# Patient Record
Sex: Female | Born: 1975
Health system: Southern US, Community
[De-identification: ages and names within clinical notes are randomized; demographics above are authoritative.]

## PROBLEM LIST (undated history)

## (undated) ENCOUNTER — Inpatient Hospital Stay (HOSPITAL_COMMUNITY): Payer: Self-pay

## (undated) DIAGNOSIS — F419 Anxiety disorder, unspecified: Secondary | ICD-10-CM

## (undated) DIAGNOSIS — I10 Essential (primary) hypertension: Secondary | ICD-10-CM

## (undated) DIAGNOSIS — T7840XA Allergy, unspecified, initial encounter: Secondary | ICD-10-CM

## (undated) HISTORY — DX: Allergy, unspecified, initial encounter: T78.40XA

## (undated) HISTORY — DX: Essential (primary) hypertension: I10

## (undated) HISTORY — PX: BREAST SURGERY: SHX581

---

## 2000-11-20 ENCOUNTER — Other Ambulatory Visit: Admission: RE | Admit: 2000-11-20 | Discharge: 2000-11-20 | Payer: Self-pay

## 2002-09-21 ENCOUNTER — Emergency Department (HOSPITAL_COMMUNITY): Admission: EM | Admit: 2002-09-21 | Discharge: 2002-09-21 | Payer: Self-pay | Admitting: Emergency Medicine

## 2003-12-07 ENCOUNTER — Other Ambulatory Visit: Admission: RE | Admit: 2003-12-07 | Discharge: 2003-12-07 | Payer: Self-pay | Admitting: Family Medicine

## 2004-12-26 ENCOUNTER — Emergency Department (HOSPITAL_COMMUNITY): Admission: EM | Admit: 2004-12-26 | Discharge: 2004-12-26 | Payer: Self-pay | Admitting: Family Medicine

## 2005-03-14 ENCOUNTER — Other Ambulatory Visit: Admission: RE | Admit: 2005-03-14 | Discharge: 2005-03-14 | Payer: Self-pay | Admitting: Obstetrics and Gynecology

## 2008-08-17 ENCOUNTER — Emergency Department (HOSPITAL_COMMUNITY): Admission: EM | Admit: 2008-08-17 | Discharge: 2008-08-17 | Payer: Self-pay | Admitting: Emergency Medicine

## 2008-08-18 ENCOUNTER — Emergency Department (HOSPITAL_COMMUNITY): Admission: EM | Admit: 2008-08-18 | Discharge: 2008-08-18 | Payer: Self-pay | Admitting: Emergency Medicine

## 2008-08-18 ENCOUNTER — Emergency Department (HOSPITAL_COMMUNITY): Admission: EM | Admit: 2008-08-18 | Discharge: 2008-08-18 | Payer: Self-pay | Admitting: Family Medicine

## 2009-01-08 LAB — HM PAP SMEAR

## 2011-12-21 ENCOUNTER — Ambulatory Visit (INDEPENDENT_AMBULATORY_CARE_PROVIDER_SITE_OTHER): Payer: 59 | Admitting: Internal Medicine

## 2011-12-21 ENCOUNTER — Encounter: Payer: Self-pay | Admitting: Internal Medicine

## 2011-12-21 ENCOUNTER — Other Ambulatory Visit (INDEPENDENT_AMBULATORY_CARE_PROVIDER_SITE_OTHER): Payer: 59

## 2011-12-21 ENCOUNTER — Other Ambulatory Visit: Payer: Self-pay | Admitting: Internal Medicine

## 2011-12-21 ENCOUNTER — Telehealth: Payer: Self-pay | Admitting: *Deleted

## 2011-12-21 VITALS — BP 148/90 | HR 87 | Temp 98.4°F | Ht 62.0 in | Wt 197.0 lb

## 2011-12-21 DIAGNOSIS — Z0000 Encounter for general adult medical examination without abnormal findings: Secondary | ICD-10-CM

## 2011-12-21 DIAGNOSIS — Z1321 Encounter for screening for nutritional disorder: Secondary | ICD-10-CM

## 2011-12-21 DIAGNOSIS — I10 Essential (primary) hypertension: Secondary | ICD-10-CM

## 2011-12-21 DIAGNOSIS — Z13220 Encounter for screening for lipoid disorders: Secondary | ICD-10-CM

## 2011-12-21 DIAGNOSIS — Z1329 Encounter for screening for other suspected endocrine disorder: Secondary | ICD-10-CM

## 2011-12-21 DIAGNOSIS — J329 Chronic sinusitis, unspecified: Secondary | ICD-10-CM

## 2011-12-21 DIAGNOSIS — Z131 Encounter for screening for diabetes mellitus: Secondary | ICD-10-CM

## 2011-12-21 DIAGNOSIS — Z130 Encounter for screening for diseases of the blood and blood-forming organs and certain disorders involving the immune mechanism: Secondary | ICD-10-CM

## 2011-12-21 DIAGNOSIS — J309 Allergic rhinitis, unspecified: Secondary | ICD-10-CM

## 2011-12-21 DIAGNOSIS — J302 Other seasonal allergic rhinitis: Secondary | ICD-10-CM

## 2011-12-21 LAB — CBC
HCT: 40.1 % (ref 36.0–46.0)
Hemoglobin: 14 g/dL (ref 12.0–15.0)
MCHC: 34.9 g/dL (ref 30.0–36.0)
MCV: 90.2 fl (ref 78.0–100.0)
Platelets: 392 10*3/uL (ref 150.0–400.0)
RBC: 4.45 Mil/uL (ref 3.87–5.11)
RDW: 12.8 % (ref 11.5–14.6)
WBC: 4.6 10*3/uL (ref 4.5–10.5)

## 2011-12-21 LAB — BASIC METABOLIC PANEL
BUN: 9 mg/dL (ref 6–23)
CO2: 24 mEq/L (ref 19–32)
Chloride: 103 mEq/L (ref 96–112)
Creatinine, Ser: 0.7 mg/dL (ref 0.4–1.2)
Glucose, Bld: 90 mg/dL (ref 70–99)
Potassium: 4 mEq/L (ref 3.5–5.1)
Sodium: 136 mEq/L (ref 135–145)

## 2011-12-21 LAB — LIPID PANEL
Cholesterol: 209 mg/dL — ABNORMAL HIGH (ref 0–200)
HDL: 75.3 mg/dL (ref >39.00)
Total CHOL/HDL Ratio: 3
Triglycerides: 58 mg/dL (ref 0.0–149.0)
VLDL: 11.6 mg/dL (ref 0.0–40.0)

## 2011-12-21 LAB — TSH: TSH: 2.1 u[IU]/mL (ref 0.35–5.50)

## 2011-12-21 LAB — HEMOGLOBIN A1C: Hgb A1c MFr Bld: 5.5 % (ref 4.6–6.5)

## 2011-12-21 LAB — LDL CHOLESTEROL, DIRECT: Direct LDL: 119.1 mg/dL

## 2011-12-21 MED ORDER — AMOXICILLIN-POT CLAVULANATE 875-125 MG PO TABS: 1.0000 | ORAL_TABLET | Freq: Two times a day (BID) | ORAL | Status: DC

## 2011-12-21 MED ORDER — OLOPATADINE HCL 0.2 % OP SOLN: 1.0000 [drp] | OPHTHALMIC | Status: DC

## 2011-12-21 MED ORDER — AMLODIPINE BESYLATE 5 MG PO TABS: 5.0000 mg | ORAL_TABLET | Freq: Every day | ORAL | Status: DC

## 2011-12-21 NOTE — Progress Notes (Signed)
HPI  Pt presents to the clinic today to establish care. She has not seen a primary care doctor in a number of years. She does have concerns about her blood pressure. She has noticed over the past couple of weeks that it has been as elevated as 180/100. She does consume a lot of salt in her diet. Additionally, she also c/o allergy symptoms including itchy watery eyes. She has been on Zyrtec for a while but it does not seem to be helping her eye situation. He eyes water constantly. It is aggravated by her constant wiping with tissue to try to wipe away the watery tears. She has not had this evaluated. She is also c/o sinus pain and pressure, headache and sore throat x 10 days. She has taken Mucinex DM without relief. She has a history of seasonal allergies and has recurrent sinus infections and feels like that is what is going on now.  Flu shot: 11/2011 Pap Smear: 2012  Past Medical History  Diagnosis Date  . Allergy   . Hypertension     Current Outpatient Prescriptions  Medication Sig Dispense Refill  . cetirizine (ZYRTEC) 10 MG tablet Take 10 mg by mouth daily.        No Known Allergies  Family History  Problem Relation Age of Onset  . Hypertension Mother   . Diabetes Father   . Anuerysm Maternal Aunt   . Stroke Maternal Grandmother   . Cancer Neg Hx   . Early death Neg Hx     History   Social History  . Marital Status: Single    Spouse Name: N/A    Number of Children: 0  . 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 Smoking status: Never Smoker   . Smokeless tobacco: Never Used  . Alcohol Use: 0.6 oz/week    1 Glasses of wine per week  . Drug Use: No  . Sexually Active: Yes    Birth Control/ Protection: Condom   Other Topics Concern  . Not on file   Social History Narrative   Regular exercise-noCaffeine Use-yes    ROS:  Constitutional: Pt reports fatigue and headache. Denies fever, malaise, or abrupt weight  changes.  HEENT: Pt reports facial pain and pressure as well as sore throat. Pt also reports watery eyes. Denies eye pain, eye redness, ear pain, ringing in the ears, wax buildup, runny nose, nasal congestion, bloody nose. Respiratory: Denies difficulty breathing, shortness of breath, cough or sputum production.   Cardiovascular: Denies chest pain, chest tightness, palpitations or swelling in the hands or feet.  Gastrointestinal: Denies abdominal pain, bloating, constipation, diarrhea or blood in the stool.  GU: Denies frequency, urgency, pain with urination, blood in urine, odor or discharge. Musculoskeletal: Denies decrease in range of motion, difficulty with gait, muscle pain or joint pain and swelling.  Skin: Denies redness, rashes, lesions or ulcercations.  Neurological: Denies dizziness, difficulty with memory, difficulty with speech or problems with balance and coordination.   No other specific complaints in a complete review of systems (except as listed in HPI above).  PE:  BP 148/90  Pulse 87  Temp 98.4 F (36.9 C) (Oral)  Ht 5\' 2"  (1.575 m)  Wt 197 lb (89.359 kg)  BMI 36.03 kg/m2  SpO2 97%  LMP 11/09/2011 Wt Readings from Last 3 Encounters:  12/21/11 197 lb (89.359 kg)    General: Appears her stated age, overweight but well  developed, well nourished in NAD. HEENT: Head: normal shape and size; Tenderness to palpation of frontal and maxillary sinuses. Eyes: sclera injected and watery, no icterus, conjunctiva pink, PERRLA and EOMs intact; Ears: Tm's gray and intact, normal light reflex; Nose: mucosa pink and moist, septum midline; Throat/Mouth: Teeth present, mucosa pink and moist, no lesions or ulcerations noted.  Neck: Normal range of motion. Neck supple, trachea midline. No massses, lumps or thyromegaly present.  Cardiovascular: Normal rate and rhythm. S1,S2 noted.  No murmur, rubs or gallops noted. No JVD or BLE edema. No carotid bruits noted. Pulmonary/Chest: Normal effort  and positive vesicular breath sounds. No respiratory distress. No wheezes, rales or ronchi noted.  Abdomen: Soft and nontender. Normal bowel sounds, no bruits noted. No distention or masses noted. Liver, spleen and kidneys non palpable. Musculoskeletal: Normal range of motion. No signs of joint swelling. No difficulty with gait.  Neurological: Alert and oriented. Cranial nerves II-XII intact. Coordination normal. +DTRs bilaterally. Psychiatric: Mood and affect normal. Behavior is normal. Judgment and thought content normal.    Assessment and Plan:  Preventative Health Maintenance:  Start a diet and exercise program Schedule a pap smear for 2014 Obtain basic screening labs today: CBC, BMET, lipids, HgbA1c, Vit d, TSH  Hypertension, new onset with additional workup required:  Consume a low salt diet Start Norvasc 5 mg daily Referral for nutritional counseling per patient request  Seasonal Allergies, new onset with additional workup required:  Continue taking Zyrtec daily Start Pataday, 1 drop both eyes daily  Acute Sinusitis, new onset with additional workup required:  Can use a Neti pot OTC  Augmentin BID x 10 days  RTC in 3 weeks for blood pressure check

## 2011-12-21 NOTE — Patient Instructions (Addendum)
Health Maintenance, Females A healthy lifestyle and preventative care can promote health and wellness.  Maintain regular health, dental, and eye exams.   Eat a healthy diet. Foods like vegetables, fruits, whole grains, low-fat dairy products, and lean protein foods contain the nutrients you need without too many calories. Decrease your intake of foods high in solid fats, added sugars, and salt. Get information about a proper diet from your caregiver, if necessary.   Regular physical exercise is one of the most important things you can do for your health. Most adults should get at least 150 minutes of moderate-intensity exercise (any activity that increases your heart rate and causes you to sweat) each week. In addition, most adults need muscle-strengthening exercises on 2 or more days a week.     Maintain a healthy weight. The body mass index (BMI) is a screening tool to identify possible weight problems. It provides an estimate of body fat based on height and weight. Your caregiver can help determine your BMI, and can help you achieve or maintain a healthy weight. For adults 20 years and older:   A BMI below 18.5 is considered underweight.   A BMI of 18.5 to 24.9 is normal.   A BMI of 25 to 29.9 is considered overweight.   A BMI of 30 and above is considered obese.   Maintain normal blood lipids and cholesterol by exercising and minimizing your intake of saturated fat. Eat a balanced diet with plenty of fruits and vegetables. Blood tests for lipids and cholesterol should begin at age 20 and be repeated every 5 years. If your lipid or cholesterol levels are high, you are over 50, or you are a high risk for heart disease, you may need your cholesterol levels checked more frequently. Ongoing high lipid and cholesterol levels should be treated with medicines if diet and exercise are not effective.   If you smoke, find out from your caregiver how to quit. If you do not use tobacco, do not start.    If you are pregnant, do not drink alcohol. If you are breastfeeding, be very cautious about drinking alcohol. If you are not pregnant and choose to drink alcohol, do not exceed 1 drink per day. One drink is considered to be 12 ounces (355 mL) of beer, 5 ounces (148 mL) of wine, or 1.5 ounces (44 mL) of liquor.   Avoid use of street drugs. Do not share needles with anyone. Ask for help if you need support or instructions about stopping the use of drugs.   High blood pressure causes heart disease and increases the risk of stroke. Blood pressure should be checked at least every 1 to 2 years. Ongoing high blood pressure should be treated with medicines, if weight loss and exercise are not effective.   If you are 55 to 36 years old, ask your caregiver if you should take aspirin to prevent strokes.   Diabetes screening involves taking a blood sample to check your fasting blood sugar level. This should be done once every 3 years, after age 45, if you are within normal weight and without risk factors for diabetes. Testing should be considered at a younger age or be carried out more frequently if you are overweight and have at least 1 risk factor for diabetes.   Breast cancer screening is essential preventative care for women. You should practice "breast self-awareness." This means understanding the normal appearance and feel of your breasts and may include breast self-examination. Any changes detected, no   matter how small, should be reported to a caregiver. Women in their 20s and 30s should have a clinical breast exam (CBE) by a caregiver as part of a regular health exam every 1 to 3 years. After age 40, women should have a CBE every year. Starting at age 40, women should consider having a mammogram (breast X-ray) every year. Women who have a family history of breast cancer should talk to their caregiver about genetic screening. Women at a high risk of breast cancer should talk to their caregiver about having  an MRI and a mammogram every year.   The Pap test is a screening test for cervical cancer. Women should have a Pap test starting at age 21. Between ages 21 and 29, Pap tests should be repeated every 2 years. Beginning at age 30, you should have a Pap test every 3 years as long as the past 3 Pap tests have been normal. If you had a hysterectomy for a problem that was not cancer or a condition that could lead to cancer, then you no longer need Pap tests. If you are between ages 65 and 70, and you have had normal Pap tests going back 10 years, you no longer need Pap tests. If you have had past treatment for cervical cancer or a condition that could lead to cancer, you need Pap tests and screening for cancer for at least 20 years after your treatment. If Pap tests have been discontinued, risk factors (such as a new sexual partner) need to be reassessed to determine if screening should be resumed. Some women have medical problems that increase the chance of getting cervical cancer. In these cases, your caregiver may recommend more frequent screening and Pap tests.   The human papillomavirus (HPV) test is an additional test that may be used for cervical cancer screening. The HPV test looks for the virus that can cause the cell changes on the cervix. The cells collected during the Pap test can be tested for HPV. The HPV test could be used to screen women aged 30 years and older, and should be used in women of any age who have unclear Pap test results. After the age of 30, women should have HPV testing at the same frequency as a Pap test.   Colorectal cancer can be detected and often prevented. Most routine colorectal cancer screening begins at the age of 50 and continues through age 75. However, your caregiver may recommend screening at an earlier age if you have risk factors for colon cancer. On a yearly basis, your caregiver may provide home test kits to check for hidden blood in the stool. Use of a small camera at  the end of a tube, to directly examine the colon (sigmoidoscopy or colonoscopy), can detect the earliest forms of colorectal cancer. Talk to your caregiver about this at age 50, when routine screening begins. Direct examination of the colon should be repeated every 5 to 10 years through age 75, unless early forms of pre-cancerous polyps or small growths are found.   Hepatitis C blood testing is recommended for all people born from 1945 through 1965 and any individual with known risks for hepatitis C.   Practice safe sex. Use condoms and avoid high-risk sexual practices to reduce the spread of sexually transmitted infections (STIs). Sexually active women aged 25 and younger should be checked for Chlamydia, which is a common sexually transmitted infection. Older women with new or multiple partners should also be tested for Chlamydia. Testing   for other STIs is recommended if you are sexually active and at increased risk.   Osteoporosis is a disease in which the bones lose minerals and strength with aging. This can result in serious bone fractures. The risk of osteoporosis can be identified using a bone density scan. Women ages 65 and over and women at risk for fractures or osteoporosis should discuss screening with their caregivers. Ask your caregiver whether you should be taking a calcium supplement or vitamin D to reduce the rate of osteoporosis.   Menopause can be associated with physical symptoms and risks. Hormone replacement therapy is available to decrease symptoms and risks. You should talk to your caregiver about whether hormone replacement therapy is right for you.   Use sunscreen with a sun protection factor (SPF) of 30 or greater. Apply sunscreen liberally and repeatedly throughout the day. You should seek shade when your shadow is shorter than you. Protect yourself by wearing long sleeves, pants, a wide-brimmed hat, and sunglasses year round, whenever you are outdoors.   Notify your caregiver  of new moles or changes in moles, especially if there is a change in shape or color. Also notify your caregiver if a mole is larger than the size of a pencil eraser.   Stay current with your immunizations.  Document Released: 07/10/2010 Document Revised: 03/19/2011 Document Reviewed: 07/10/2010 [employer] Patient Information 2013 [employer].   Hypertension As your heart beats, it forces blood through your arteries. This force is your blood pressure. If the pressure is too high, it is called hypertension (HTN) or high blood pressure. HTN is dangerous because you may have it and not know it. High blood pressure may mean that your heart has to work harder to pump blood. Your arteries may be narrow or stiff. The extra work puts you at risk for heart disease, stroke, and other problems.   Blood pressure consists of two numbers, a higher number over a lower, 110/72, for example. It is stated as "110 over 72." The ideal is below 120 for the top number (systolic) and under 80 for the bottom (diastolic). Write down your blood pressure today. You should pay close attention to your blood pressure if you have certain conditions such as:  Heart failure.   Prior heart attack.   Diabetes   Chronic kidney disease.   Prior stroke.   Multiple risk factors for heart disease.  To see if you have HTN, your blood pressure should be measured while you are seated with your arm held at the level of the heart. It should be measured at least twice. A one-time elevated blood pressure reading (especially in the Emergency Department) does not mean that you need treatment. There may be conditions in which the blood pressure is different between your right and left arms. It is important to see your caregiver soon for a recheck. Most people have essential hypertension which means that there is not a specific cause. This type of high blood pressure may be lowered by changing lifestyle factors such as:  Stress.   Smoking.    Lack of exercise.   Excessive weight.   Drug/tobacco/alcohol use.   Eating less salt.  Most people do not have symptoms from high blood pressure until it has caused damage to the body. Effective treatment can often prevent, delay or reduce that damage. TREATMENT   When a cause has been identified, treatment for high blood pressure is directed at the cause. There are a large number of medications to treat   HTN. These fall into several categories, and your caregiver will help you select the medicines that are best for you. Medications may have side effects. You should review side effects with your caregiver. If your blood pressure stays high after you have made lifestyle changes or started on medicines,    Your medication(s) may need to be changed.   Other problems may need to be addressed.   Be certain you understand your prescriptions, and know how and when to take your medicine.   Be sure to follow up with your caregiver within the time frame advised (usually within two weeks) to have your blood pressure rechecked and to review your medications.   If you are taking more than one medicine to lower your blood pressure, make sure you know how and at what times they should be taken. Taking two medicines at the same time can result in blood pressure that is too low.  SEEK IMMEDIATE MEDICAL CARE IF:  You develop a severe headache, blurred or changing vision, or confusion.   You have unusual weakness or numbness, or a faint feeling.   You have severe chest or abdominal pain, vomiting, or breathing problems.  MAKE SURE YOU:    Understand these instructions.   Will watch your condition.   Will get help right away if you are not doing well or get worse.  Document Released: 12/25/2004 Document Revised: 03/19/2011 Document Reviewed: 08/15/2007 [employer] Patient Information 2013 [employer].    

## 2011-12-21 NOTE — Telephone Encounter (Signed)
Left message for pt to callback office.    Ash,  Can you please send this and call Mrs. Bensen and let her know her cholesterol is elevated.I suggest 3 months of lifestyle changes including avoiding fried foods and fast food in combination with at least 30 minutes of exercise 4 days out of the week. You should return for an OV in 3 months to reevaluate this. Thx!  Rene Kocher

## 2011-12-21 NOTE — Telephone Encounter (Signed)
Pt informed of results and of NP's advisement.

## 2011-12-22 ENCOUNTER — Other Ambulatory Visit: Payer: Self-pay | Admitting: Internal Medicine

## 2011-12-22 LAB — VITAMIN D 25 HYDROXY (VIT D DEFICIENCY, FRACTURES): Vit D, 25-Hydroxy: 13 ng/mL — ABNORMAL LOW (ref 30–89)

## 2011-12-22 MED ORDER — ERGOCALCIFEROL 1.25 MG (50000 UT) PO CAPS: 50000.0000 [IU] | ORAL_CAPSULE | ORAL | Status: DC

## 2012-01-10 ENCOUNTER — Ambulatory Visit: Payer: 59 | Admitting: *Deleted

## 2012-01-10 ENCOUNTER — Ambulatory Visit (INDEPENDENT_AMBULATORY_CARE_PROVIDER_SITE_OTHER): Payer: 59 | Admitting: *Deleted

## 2012-01-10 VITALS — BP 128/84

## 2012-01-10 DIAGNOSIS — I10 Essential (primary) hypertension: Secondary | ICD-10-CM

## 2012-03-17 ENCOUNTER — Other Ambulatory Visit: Payer: Self-pay | Admitting: Internal Medicine

## 2012-05-09 ENCOUNTER — Ambulatory Visit: Payer: 59 | Admitting: Internal Medicine

## 2012-05-09 ENCOUNTER — Telehealth: Payer: Self-pay | Admitting: *Deleted

## 2012-05-09 NOTE — Telephone Encounter (Signed)
Pt wants to know if she can make a nurse visit to start Depo Provera or does she need to make an OV with you. She also wants to know if there are certain requirements before she can start the Injection (before/after menstrual cycle, etc).

## 2012-05-13 ENCOUNTER — Ambulatory Visit: Payer: 59 | Admitting: Internal Medicine

## 2012-05-13 NOTE — Telephone Encounter (Signed)
Okay for nurse visit to begin Depo-Provera  Recommend beginning first injection within 5 days of menses onset, repeat every 90 days (also neg UPT)

## 2012-05-13 NOTE — Telephone Encounter (Signed)
Called pt, no answer/unable to leave message (connection issue).

## 2012-05-14 NOTE — Telephone Encounter (Signed)
Pt informed of MD's advisement via VM and to callback office with any questions/concerns.  

## 2012-06-06 ENCOUNTER — Ambulatory Visit: Payer: 59 | Admitting: Internal Medicine

## 2012-06-06 DIAGNOSIS — Z0289 Encounter for other administrative examinations: Secondary | ICD-10-CM

## 2012-06-11 ENCOUNTER — Ambulatory Visit: Payer: 59 | Admitting: Internal Medicine

## 2012-06-11 DIAGNOSIS — Z0289 Encounter for other administrative examinations: Secondary | ICD-10-CM

## 2012-06-17 ENCOUNTER — Ambulatory Visit: Payer: 59 | Admitting: Internal Medicine

## 2012-06-17 DIAGNOSIS — Z0289 Encounter for other administrative examinations: Secondary | ICD-10-CM

## 2012-07-16 ENCOUNTER — Ambulatory Visit (INDEPENDENT_AMBULATORY_CARE_PROVIDER_SITE_OTHER): Payer: 59 | Admitting: Internal Medicine

## 2012-07-16 ENCOUNTER — Encounter: Payer: Self-pay | Admitting: Internal Medicine

## 2012-07-16 VITALS — BP 156/102 | HR 107 | Temp 97.8°F

## 2012-07-16 DIAGNOSIS — I10 Essential (primary) hypertension: Secondary | ICD-10-CM

## 2012-07-16 DIAGNOSIS — R609 Edema, unspecified: Secondary | ICD-10-CM

## 2012-07-16 MED ORDER — HYDROCHLOROTHIAZIDE 12.5 MG PO CAPS: 12.5000 mg | ORAL_CAPSULE | ORAL | Status: DC

## 2012-07-16 NOTE — Patient Instructions (Signed)

## 2012-07-16 NOTE — Progress Notes (Signed)
Subjective:    Patient ID: Rebekah Irwin, female    DOB: 1975-03-16, 37 y.o.   MRN: [id number]  HPI  Pt presents to the clinic today with c/o elevated blood pressure and lower leg edema x 3 weeks. She does have HTN. She is taking her Norvasc. The swelling is worse as the day progresses and better first thing in the morning. She has never had a issue with swelling in the past. She denies chest pain, chest tightness, dizziness or shortness of breath.  Review of Systems  Past Medical History  Diagnosis Date  . Allergy   . Hypertension     Current Outpatient Prescriptions  Medication Sig Dispense Refill  . amLODipine (NORVASC) 5 MG tablet Take 1 tablet (5 mg total) by mouth daily.  90 tablet  3  . cetirizine (ZYRTEC) 10 MG tablet Take 10 mg by mouth daily.      . Olopatadine HCl 0.2 % SOLN Apply 1 drop to eye 1 day or 1 dose.  1 Bottle  2  . hydrochlorothiazide (MICROZIDE) 12.5 MG capsule Take 1 capsule (12.5 mg total) by mouth every morning.  30 capsule  0   No current facility-administered medications for this visit.    No Known Allergies  Family History  Problem Relation Age of Onset  . Hypertension Mother   . Diabetes Father   . Anuerysm Maternal Aunt   . Stroke Maternal Grandmother   . Cancer Neg Hx   . Early death Neg Hx     History   Social History  . Marital Status: Single    Spouse Name: N/A    Number of Children: 0  . 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 Smoking status: Never Smoker   . Smokeless tobacco: Never Used  . Alcohol Use: 0.6 oz/week    1 Glasses of wine per week  . Drug Use: No  . Sexually Active: Yes    Birth Control/ Protection: Condom   Other Topics Concern  . Not on file   Social History Narrative   Regular exercise-no   Caffeine Use-yes     Constitutional: Denies fever, malaise, fatigue, headache or abrupt weight changes.  Respiratory: Denies difficulty breathing,  shortness of breath, cough or sputum production.   Cardiovascular: Pt reports lower leg edema. Denies chest pain, chest tightness, palpitations or swelling in the hands.  Neurological: Denies dizziness, difficulty with memory, difficulty with speech or problems with balance and coordination.   No other specific complaints in a complete review of systems (except as listed in HPI above).     Objective:   Physical Exam  BP 156/102  Pulse 107  Temp(Src) 97.8 F (36.6 C) (Oral)  SpO2 97% Wt Readings from Last 3 Encounters:  12/21/11 197 lb (89.359 kg)    General: Appears her stated age, overweight but well developed, well nourished in NAD. Cardiovascular: Normal rate and rhythm. S1,S2 noted.  No murmur, rubs or gallops noted. No JVD, 2+ edema of ankles/feet bilaterally. No carotid bruits noted. Pulmonary/Chest: Normal effort and positive vesicular breath sounds. No respiratory distress. No wheezes, rales or ronchi noted.  Neurological: Alert and oriented. Cranial nerves II-XII intact. Coordination normal. +DTRs bilaterally.   BMET    Component Value Date/Time   NA 136 12/21/2011 0916   K 4.0 12/21/2011 0916   CL 103 12/21/2011 0916   CO2 24 12/21/2011 0916   GLUCOSE 90 12/21/2011 0916  BUN 9 12/21/2011 0916   CREATININE 0.7 12/21/2011 0916   CALCIUM 9.3 12/21/2011 0916    Lipid Panel     Component Value Date/Time   CHOL 209* 12/21/2011 0916   TRIG 58.0 12/21/2011 0916   HDL 75.30 12/21/2011 0916   CHOLHDL 3 12/21/2011 0916   VLDL 11.6 12/21/2011 0916    CBC    Component Value Date/Time   WBC 4.6 12/21/2011 0916   RBC 4.45 12/21/2011 0916   HGB 14.0 12/21/2011 0916   HCT 40.1 12/21/2011 0916   PLT 392.0 12/21/2011 0916   MCV 90.2 12/21/2011 0916   MCHC 34.9 12/21/2011 0916   RDW 12.8 12/21/2011 0916    Hgb A1C Lab Results  Component Value Date   HGBA1C 5.5 12/21/2011         Assessment & Plan:

## 2012-07-16 NOTE — Assessment & Plan Note (Signed)
Elevated today Continue Norvasc D/t peripheral edema will add HCTZ  RTC in 1 month to reevaluate and also check potassium

## 2012-08-15 ENCOUNTER — Ambulatory Visit: Payer: 59 | Admitting: Internal Medicine

## 2012-08-15 ENCOUNTER — Other Ambulatory Visit (INDEPENDENT_AMBULATORY_CARE_PROVIDER_SITE_OTHER): Payer: 59

## 2012-08-15 ENCOUNTER — Encounter: Payer: Self-pay | Admitting: Internal Medicine

## 2012-08-15 ENCOUNTER — Ambulatory Visit (INDEPENDENT_AMBULATORY_CARE_PROVIDER_SITE_OTHER): Payer: 59 | Admitting: Internal Medicine

## 2012-08-15 VITALS — BP 142/100 | HR 105 | Temp 97.2°F | Wt 195.0 lb

## 2012-08-15 DIAGNOSIS — I10 Essential (primary) hypertension: Secondary | ICD-10-CM

## 2012-08-15 DIAGNOSIS — R609 Edema, unspecified: Secondary | ICD-10-CM

## 2012-08-15 LAB — BASIC METABOLIC PANEL
BUN: 5 mg/dL — ABNORMAL LOW (ref 6–23)
Chloride: 105 mEq/L (ref 96–112)
Creatinine, Ser: 0.7 mg/dL (ref 0.4–1.2)
GFR: 117 mL/min (ref >60.00)
Potassium: 3.7 mEq/L (ref 3.5–5.1)
Sodium: 138 mEq/L (ref 135–145)

## 2012-08-15 MED ORDER — HYDROCHLOROTHIAZIDE 12.5 MG PO CAPS: 12.5000 mg | ORAL_CAPSULE | ORAL | Status: DC

## 2012-08-15 MED ORDER — AMLODIPINE BESYLATE 5 MG PO TABS: 5.0000 mg | ORAL_TABLET | Freq: Every day | ORAL | Status: DC

## 2012-08-15 NOTE — Progress Notes (Signed)
Subjective:    Patient ID: Rebekah Irwin, female    DOB: 1975-09-20, 37 y.o.   MRN: [id number]  HPI  Pt presents to the clinic today for f/u HTN. At her last visit, she was also noted to be hypertensive with peripheral edema. HCTZ was added to her regimen. The swelling in her feet have improved. Her blood pressure is elevated but she has been out of her medications for 2 days. She denies headaches, chest pain, chest tightness, or shortness of breath.  Review of Systems      Past Medical History  Diagnosis Date  . Allergy   . Hypertension     Current Outpatient Prescriptions  Medication Sig Dispense Refill  . amLODipine (NORVASC) 5 MG tablet Take 1 tablet (5 mg total) by mouth daily.  90 tablet  3  . hydrochlorothiazide (MICROZIDE) 12.5 MG capsule Take 1 capsule (12.5 mg total) by mouth every morning.  30 capsule  0   No current facility-administered medications for this visit.    No Known Allergies  Family History  Problem Relation Age of Onset  . Hypertension Mother   . Diabetes Father   . Anuerysm Maternal Aunt   . Stroke Maternal Grandmother   . Cancer Neg Hx   . Early death Neg Hx     History   Social History  . Marital Status: Single    Spouse Name: N/A    Number of Children: 0  . 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 Smoking status: Never Smoker   . Smokeless tobacco: Never Used  . Alcohol Use: 0.6 oz/week    1 Glasses of wine per week  . Drug Use: No  . Sexually Active: Yes    Birth Control/ Protection: Condom   Other Topics Concern  . Not on file   Social History Narrative   Regular exercise-no   Caffeine Use-yes     Constitutional: Denies fever, malaise, fatigue, headache or abrupt weight changes.  Respiratory: Denies difficulty breathing, shortness of breath, cough or sputum production.   Cardiovascular: Denies chest pain, chest tightness, palpitations or swelling in the hands  or feet.   Skin: Denies redness, rashes, lesions or ulcercations.  Neurological: Denies dizziness, difficulty with memory, difficulty with speech or problems with balance and coordination.   No other specific complaints in a complete review of systems (except as listed in HPI above).  Objective:   Physical Exam   BP 142/100  Pulse 105  Temp(Src) 97.2 F (36.2 C) (Oral)  Wt 195 lb (88.451 kg)  BMI 35.66 kg/m2  SpO2 98% Wt Readings from Last 3 Encounters:  08/15/12 195 lb (88.451 kg)  12/21/11 197 lb (89.359 kg)    General: Appears her stated age, well developed, well nourished in NAD. Skin: Warm, dry and intact. No rashes, lesions or ulcerations noted. Cardiovascular: Normal rate and rhythm. S1,S2 noted.  No murmur, rubs or gallops noted. No JVD or BLE edema. No carotid bruits noted. Pulmonary/Chest: Normal effort and positive vesicular breath sounds. No respiratory distress. No wheezes, rales or ronchi noted.   Neurological: Alert and oriented. Cranial nerves II-XII intact. Coordination normal. +DTRs bilaterally.   BMET    Component Value Date/Time   NA 136 12/21/2011 0916   K 4.0 12/21/2011 0916   CL 103 12/21/2011 0916   CO2 24 12/21/2011 0916   GLUCOSE 90 12/21/2011 0916   BUN 9 12/21/2011 0916  CREATININE 0.7 12/21/2011 0916   CALCIUM 9.3 12/21/2011 0916    Lipid Panel     Component Value Date/Time   CHOL 209* 12/21/2011 0916   TRIG 58.0 12/21/2011 0916   HDL 75.30 12/21/2011 0916   CHOLHDL 3 12/21/2011 0916   VLDL 11.6 12/21/2011 0916    CBC    Component Value Date/Time   WBC 4.6 12/21/2011 0916   RBC 4.45 12/21/2011 0916   HGB 14.0 12/21/2011 0916   HCT 40.1 12/21/2011 0916   PLT 392.0 12/21/2011 0916   MCV 90.2 12/21/2011 0916   MCHC 34.9 12/21/2011 0916   RDW 12.8 12/21/2011 0916    Hgb A1C Lab Results  Component Value Date   HGBA1C 5.5 12/21/2011        Assessment & Plan:

## 2012-08-15 NOTE — Addendum Note (Signed)
Addended by: Lorre Munroe on: 08/15/2012 03:52 PM   Modules accepted: Orders

## 2012-08-15 NOTE — Addendum Note (Signed)
Addended by: Lorre Munroe on: 08/15/2012 04:05 PM   Modules accepted: Orders

## 2012-08-15 NOTE — Patient Instructions (Signed)

## 2012-08-15 NOTE — Assessment & Plan Note (Addendum)
Elevated today but has been out of meds x 2 days Pt has not been taking Norvasc at all HCTZ 12.5 continue Will check BMET to make sure potassium in not too low  RTC in 2 weeks

## 2012-08-18 ENCOUNTER — Ambulatory Visit: Payer: 59 | Admitting: Internal Medicine

## 2012-08-28 ENCOUNTER — Encounter: Payer: Self-pay | Admitting: *Deleted

## 2012-09-03 ENCOUNTER — Encounter: Payer: Self-pay | Admitting: Internal Medicine

## 2012-09-03 ENCOUNTER — Ambulatory Visit (INDEPENDENT_AMBULATORY_CARE_PROVIDER_SITE_OTHER): Payer: 59 | Admitting: Internal Medicine

## 2012-09-03 VITALS — BP 156/94 | HR 100

## 2012-09-03 DIAGNOSIS — I10 Essential (primary) hypertension: Secondary | ICD-10-CM

## 2012-09-03 MED ORDER — AMLODIPINE BESYLATE 10 MG PO TABS: 10.0000 mg | ORAL_TABLET | Freq: Every day | ORAL | Status: DC

## 2012-09-03 MED ORDER — HYDROCHLOROTHIAZIDE 25 MG PO TABS: 25.0000 mg | ORAL_TABLET | Freq: Every day | ORAL | Status: DC

## 2012-09-03 NOTE — Patient Instructions (Signed)

## 2012-09-03 NOTE — Assessment & Plan Note (Signed)
Will increase norvasc and HCTZ Continue to work on diet and exercise  RTC in 2 week for bp check

## 2012-09-03 NOTE — Progress Notes (Signed)
Subjective:    Patient ID: Rebekah Irwin, female    DOB: 02-Feb-1975, 37 y.o.   MRN: [id number]  HPI  Pt presents to the clinic today with c/o elevated blood pressure. She was here for a nurse visit blood pressure check. Her BP was 156/94. She is on norvasc 5 md daily and hctz 12.5 mg daily. She is compliant with her medications. She has lost 5 lbs. She has cut all salt out of her diet.  Review of Systems      Past Medical History  Diagnosis Date  . Allergy   . Hypertension     Current Outpatient Prescriptions  Medication Sig Dispense Refill  . amLODipine (NORVASC) 5 MG tablet Take 1 tablet (5 mg total) by mouth daily.  90 tablet  3  . hydrochlorothiazide (MICROZIDE) 12.5 MG capsule Take 1 capsule (12.5 mg total) by mouth every morning.  30 capsule  0   No current facility-administered medications for this visit.    No Known Allergies  Family History  Problem Relation Age of Onset  . Hypertension Mother   . Diabetes Father   . Anuerysm Maternal Aunt   . Stroke Maternal Grandmother   . Cancer Neg Hx   . Early death Neg Hx     History   Social History  . Marital Status: Single    Spouse Name: N/A    Number of Children: 0  . 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 Smoking status: Never Smoker   . Smokeless tobacco: Never Used  . Alcohol Use: 0.6 oz/week    1 Glasses of wine per week  . Drug Use: No  . Sexual Activity: Yes    Birth Control/ Protection: Condom   Other Topics Concern  . Not on file   Social History Narrative   Regular exercise-no   Caffeine Use-yes     Constitutional: Denies fever, malaise, fatigue, headache or abrupt weight changes.  HEENT: Denies eye pain, eye redness, ear pain, ringing in the ears, wax buildup, runny nose, nasal congestion, bloody nose, or sore throat.   Cardiovascular: Denies chest pain, chest tightness, palpitations or swelling in the hands or feet. .   Neurological: Denies dizziness, difficulty with memory, difficulty with speech or problems with balance and coordination.   No other specific complaints in a complete review of systems (except as listed in HPI above).  Objective:   Physical Exam  BP 156/94  Pulse 100  SpO2 98% Wt Readings from Last 3 Encounters:  08/15/12 195 lb (88.451 kg)  12/21/11 197 lb (89.359 kg)    General: Appears her stated age, well developed, well nourished in NAD. Skin: Warm, dry and intact. No rashes, lesions or ulcerations noted. HEENT: Head: normal shape and size; Eyes: sclera white, no icterus, conjunctiva pink, PERRLA and EOMs intact; Ears: Tm's gray and intact, normal light reflex; Nose: mucosa pink and moist, septum midline; Throat/Mouth: Teeth present, mucosa pink and moist, no exudate, lesions or ulcerations noted.  Cardiovascular: Normal rate and rhythm. S1,S2 noted.  No murmur, rubs or gallops noted. No JVD or BLE edema. No carotid bruits noted. Pulmonary/Chest: Normal effort and positive vesicular breath sounds. No respiratory distress. No wheezes, rales or ronchi noted.  Neurological: Alert and oriented. Cranial nerves II-XII intact. Coordination normal. +DTRs bilaterally.   BMET    Component Value Date/Time   NA 138 08/15/2012 1613   K 3.7 08/15/2012 1613  CL 105 08/15/2012 1613   CO2 25 08/15/2012 1613   GLUCOSE 84 08/15/2012 1613   BUN 5* 08/15/2012 1613   CREATININE 0.7 08/15/2012 1613   CALCIUM 9.2 08/15/2012 1613    Lipid Panel     Component Value Date/Time   CHOL 209* 12/21/2011 0916   TRIG 58.0 12/21/2011 0916   HDL 75.30 12/21/2011 0916   CHOLHDL 3 12/21/2011 0916   VLDL 11.6 12/21/2011 0916    CBC    Component Value Date/Time   WBC 4.6 12/21/2011 0916   RBC 4.45 12/21/2011 0916   HGB 14.0 12/21/2011 0916   HCT 40.1 12/21/2011 0916   PLT 392.0 12/21/2011 0916   MCV 90.2 12/21/2011 0916   MCHC 34.9 12/21/2011 0916   RDW 12.8 12/21/2011 0916    Hgb A1C Lab Results   Component Value Date   HGBA1C 5.5 12/21/2011         Assessment & Plan:

## 2012-11-10 ENCOUNTER — Encounter: Payer: Self-pay | Admitting: Internal Medicine

## 2012-11-11 ENCOUNTER — Ambulatory Visit (INDEPENDENT_AMBULATORY_CARE_PROVIDER_SITE_OTHER): Payer: 59 | Admitting: *Deleted

## 2012-11-11 VITALS — BP 132/80 | Wt 188.1 lb

## 2012-11-11 DIAGNOSIS — I10 Essential (primary) hypertension: Secondary | ICD-10-CM

## 2012-11-11 NOTE — Progress Notes (Signed)
Please call pt and let her know her blood pressure is better but I still think it is too high to consider phentermine at this time

## 2012-11-11 NOTE — Progress Notes (Signed)
Called pt no answer LMOM (H) with Regina response...lmb

## 2012-11-13 ENCOUNTER — Other Ambulatory Visit: Payer: Self-pay

## 2013-01-05 ENCOUNTER — Encounter: Payer: Self-pay | Admitting: Internal Medicine

## 2013-01-05 MED ORDER — AMLODIPINE BESYLATE 10 MG PO TABS: 10.0000 mg | ORAL_TABLET | Freq: Every day | ORAL | Status: DC

## 2013-01-05 MED ORDER — HYDROCHLOROTHIAZIDE 25 MG PO TABS: 25.0000 mg | ORAL_TABLET | Freq: Every day | ORAL | Status: DC

## 2013-01-22 ENCOUNTER — Encounter: Payer: Self-pay | Admitting: Internal Medicine

## 2013-02-06 ENCOUNTER — Ambulatory Visit: Payer: 59 | Admitting: Physician Assistant

## 2013-02-10 ENCOUNTER — Encounter: Payer: Self-pay | Admitting: Physician Assistant

## 2013-02-10 ENCOUNTER — Ambulatory Visit: Payer: 59 | Admitting: Physician Assistant

## 2013-02-10 ENCOUNTER — Ambulatory Visit (INDEPENDENT_AMBULATORY_CARE_PROVIDER_SITE_OTHER): Payer: 59 | Admitting: Physician Assistant

## 2013-02-10 VITALS — BP 130/92 | HR 114 | Temp 98.1°F | Ht 62.0 in | Wt 193.5 lb

## 2013-02-10 DIAGNOSIS — Z0130 Encounter for examination of blood pressure without abnormal findings: Secondary | ICD-10-CM

## 2013-02-10 DIAGNOSIS — Z136 Encounter for screening for cardiovascular disorders: Secondary | ICD-10-CM

## 2013-02-10 DIAGNOSIS — E669 Obesity, unspecified: Secondary | ICD-10-CM

## 2013-02-10 NOTE — Patient Instructions (Addendum)
It was great meeting you today Rebekah Irwin!  I have ordered labs for you.  [facility] will contact you to schedule nutritional education. If you don't hear from them in next week, please phone the patient care coordinator at [facility], [phone].  Please keep journal of exercise and food intake over the next six weeks.   Schedule a follow up appointment for evaluation of weight loss.   Exercise to Lose Weight Exercise and a healthy diet may help you lose weight. Your doctor may suggest specific exercises. EXERCISE IDEAS AND TIPS  Choose low-cost things you enjoy doing, such as walking, bicycling, or exercising to workout videos.  Take stairs instead of the elevator.  Walk during your lunch break.  Park your car further away from work or school.  Go to a gym or an exercise class.  Start with 5 to 10 minutes of exercise each day. Build up to 30 minutes of exercise 4 to 6 days a week.  Wear shoes with good support and comfortable clothes.  Stretch before and after working out.  Work out until you breathe harder and your heart beats faster.  Drink extra water when you exercise.  Do not do so much that you hurt yourself, feel dizzy, or get very short of breath. Exercises that burn about 150 calories:  Running 1  miles in 15 minutes.  Playing volleyball for 45 to 60 minutes.  Washing and waxing a car for 45 to 60 minutes.  Playing touch football for 45 minutes.  Walking 1  miles in 35 minutes.  Pushing a stroller 1  miles in 30 minutes.  Playing basketball for 30 minutes.  Raking leaves for 30 minutes.  Bicycling 5 miles in 30 minutes.  Walking 2 miles in 30 minutes.  Dancing for 30 minutes.  Shoveling snow for 15 minutes.  Swimming laps for 20 minutes.  Walking up stairs for 15 minutes.  Bicycling 4 miles in 15 minutes.  Gardening for 30 to 45 minutes.  Jumping rope for 15 minutes.  Washing windows or floors for 45 to 60  minutes. Document Released: 01/27/2010 Document Revised: 03/19/2011 Document Reviewed: 01/27/2010 [facility] Patient Information 2014 [place], [place].

## 2013-02-10 NOTE — Progress Notes (Signed)
Pre visit review using our clinic review tool, if applicable. No additional management support is needed unless otherwise documented below in the visit note. 

## 2013-02-10 NOTE — Progress Notes (Signed)
   Subjective:    Patient ID: Rebekah Irwin, female    DOB: November 26, 1975, 38 y.o.   MRN: [id number]  HPI Comments: Patient is a 38 year old female who presents to the office for a blood pressure reading and to discuss starting on a diet medication. Patient states she is treating HTN with amlodipine and HCTZ. Reports prior to starting HCTZ she was having swelling to her ankles which has since resolved. States does not take HCTZ everyday since does not having swelling any longer. States readings at home are normally 130/70-80's. Was informed by previous provider goal of blood pressure is to reach the 120/80 range before initiating weight loss medication. Reports has recently joined gym and is walk/running on tread mill 3-4 times a week and has changed diet with decreasing salt intake and increasing fruit and water. Denies chest pain/palpitations, headaches, eye pain, visual changes/disturbances, extremity swelling.   Review of Systems  Eyes: Negative for pain and visual disturbance.  Respiratory: Negative for cough, shortness of breath and wheezing.   Cardiovascular: Negative for chest pain, palpitations and leg swelling.  Gastrointestinal: Negative for nausea and vomiting.  Neurological: Negative for dizziness, weakness, light-headedness, numbness and headaches.  All other systems reviewed and are negative.   Past Medical History  Diagnosis Date  . Allergy   . Hypertension       Objective:   Physical Exam  Vitals reviewed. Constitutional: She is oriented to person, place, and time. She appears well-developed and well-nourished. No distress.  HENT:  Head: Normocephalic and atraumatic.  Eyes: Conjunctivae are normal.  Neck: Normal range of motion.  Cardiovascular: Normal rate, regular rhythm, normal heart sounds and intact distal pulses.  Exam reveals no gallop and no friction rub.   No murmur heard. Repeat bp 135/86, pulse 88 bpm, no extremity swelling  Pulmonary/Chest: Effort normal  and breath sounds normal. She has no wheezes. She has no rhonchi. She has no rales.  Musculoskeletal: Normal range of motion.  Neurological: She is alert and oriented to person, place, and time.  Skin: Skin is warm and dry.  Psychiatric: She has a normal mood and affect.    Filed Vitals:   02/10/13 1551  BP: 130/92  Pulse: 114  Temp: 98.1 F (36.7 C)      BP Readings from Last 3 Encounters:  02/10/13 130/92  11/11/12 132/80  09/03/12 156/94   Wt Readings from Last 3 Encounters:  02/10/13 193 lb 8 oz (87.771 kg)  11/11/12 188 lb 1.9 oz (85.331 kg)  08/15/12 195 lb (88.451 kg)     Assessment & Plan:    HTN: Slightly elevated, patient reports bp always worse when she comes to office.  Instructed patient to take medications as prescribed, should not be skipping doses.  Obesity: Labs ordered TSH and free T4. Reviewed last three bp readings, patient not at goal as set by previous provider. BMI of 35.5 Patient encouraged to keep up lifestyle changes she has implemented.  Patient instructed to keep journal of exercise and food intake over the next six weeks.  Patient instructed to incorporate weight training exercised to routine. Referral to nutrition education for assistance with meal planning.   Schedule a follow up appointment for evaluation of weight loss, if bp closer to goal and no improvement in weight will discuss use of weight reduction medication.   Patient stated understanding and agreement with treatment plan.

## 2013-05-11 ENCOUNTER — Other Ambulatory Visit: Payer: Self-pay | Admitting: Internal Medicine

## 2013-05-11 NOTE — Telephone Encounter (Signed)
Pt left v/m; pt used to see Webb Silversmith NP at [facility]; pt has seen PA who is not at [facility] and pt was advised to contact Webb Silversmith NP. Do not see future appt scheduled.Pt request refill  Amlodipine and HCTZ to [facility] pharmacy.Please advise.

## 2013-05-11 NOTE — Telephone Encounter (Signed)
The phone number in chart stated it was not taking incoming calls--will try again later

## 2013-05-11 NOTE — Telephone Encounter (Signed)
Med will be refilled but she either needs to make a follow up appt with me or establish with another provider

## 2013-06-05 ENCOUNTER — Ambulatory Visit: Payer: 59 | Admitting: Internal Medicine

## 2013-06-08 ENCOUNTER — Ambulatory Visit: Payer: 59 | Admitting: Internal Medicine

## 2013-08-21 ENCOUNTER — Encounter: Payer: Self-pay | Admitting: Internal Medicine

## 2013-08-21 ENCOUNTER — Ambulatory Visit (INDEPENDENT_AMBULATORY_CARE_PROVIDER_SITE_OTHER): Payer: 59 | Admitting: Internal Medicine

## 2013-08-21 ENCOUNTER — Ambulatory Visit: Payer: 59 | Admitting: Internal Medicine

## 2013-08-21 VITALS — BP 120/72 | HR 102 | Temp 98.3°F | Wt 184.0 lb

## 2013-08-21 DIAGNOSIS — F341 Dysthymic disorder: Secondary | ICD-10-CM

## 2013-08-21 DIAGNOSIS — F419 Anxiety disorder, unspecified: Secondary | ICD-10-CM | POA: Insufficient documentation

## 2013-08-21 DIAGNOSIS — F32A Depression, unspecified: Secondary | ICD-10-CM

## 2013-08-21 DIAGNOSIS — I10 Essential (primary) hypertension: Secondary | ICD-10-CM

## 2013-08-21 DIAGNOSIS — F329 Major depressive disorder, single episode, unspecified: Secondary | ICD-10-CM | POA: Insufficient documentation

## 2013-08-21 LAB — COMPREHENSIVE METABOLIC PANEL
ALBUMIN: 3.9 g/dL (ref 3.5–5.2)
ALK PHOS: 88 U/L (ref 39–117)
ALT: 15 U/L (ref 0–35)
AST: 17 U/L (ref 0–37)
BUN: 9 mg/dL (ref 6–23)
CO2: 25 mEq/L (ref 19–32)
CREATININE: 0.8 mg/dL (ref 0.4–1.2)
Calcium: 9.5 mg/dL (ref 8.4–10.5)
Chloride: 99 mEq/L (ref 96–112)
GFR: 104.55 mL/min (ref >60.00)
GLUCOSE: 92 mg/dL (ref 70–99)
Potassium: 3 mEq/L — ABNORMAL LOW (ref 3.5–5.1)
Sodium: 134 mEq/L — ABNORMAL LOW (ref 135–145)
Total Bilirubin: 0.5 mg/dL (ref 0.2–1.2)
Total Protein: 7.6 g/dL (ref 6.0–8.3)

## 2013-08-21 LAB — CBC
HCT: 38.2 % (ref 36.0–46.0)
HEMOGLOBIN: 12.8 g/dL (ref 12.0–15.0)
MCHC: 33.4 g/dL (ref 30.0–36.0)
MCV: 87.3 fl (ref 78.0–100.0)
Platelets: 302 10*3/uL (ref 150.0–400.0)
RBC: 4.38 Mil/uL (ref 3.87–5.11)
RDW: 15.3 % (ref 11.5–15.5)
WBC: 5.9 10*3/uL (ref 4.0–10.5)

## 2013-08-21 MED ORDER — FLUOXETINE HCL 20 MG PO TABS: 20.0000 mg | ORAL_TABLET | Freq: Every day | ORAL | Status: DC

## 2013-08-21 NOTE — Assessment & Plan Note (Signed)
Support offered today I encouraged her to talk with a friend or support person Will trial prozac x 4-6 weeks  RTC in 4-6 weeks to reasses

## 2013-08-21 NOTE — Progress Notes (Signed)
Subjective:    Patient ID: Rebekah Irwin, female    DOB: 01-Jan-1976, 38 y.o.   MRN: [id number]  HPI  Pt presents to the clinic to to recheck her blood pressure. At her last visit, her medication was increased. She did not come back for her follow up visit to recheck her blood pressure. Her BP today is 120/72. She denies chest pain, chest tightness or shortness of breath. She is tolerating the medications well without side effects.  She does have some concerns today about anxiety with panic attacks and depression. This started about 6 weeks ago. It is intermittent. She thinks it may be triggered by stress at work. She wants to consider starting a medication for this. She has never been treated for anxiety or depression in the past.  Review of Systems      Past Medical History  Diagnosis Date  . Allergy   . Hypertension     Current Outpatient Prescriptions  Medication Sig Dispense Refill  . amLODipine (NORVASC) 10 MG tablet TAKE 1 TABLET BY MOUTH ONCE DAILY  30 tablet  PRN  . hydrochlorothiazide (HYDRODIURIL) 25 MG tablet TAKE 1 TABLET BY MOUTH ONCE DAILY  30 tablet  PRN   No current facility-administered medications for this visit.    No Known Allergies  Family History  Problem Relation Age of Onset  . Hypertension Mother   . Diabetes Father   . Anuerysm Maternal Aunt   . Stroke Maternal Grandmother   . Cancer Neg Hx   . Early death Neg Hx     History   Social History  . Marital Status: Single    Spouse Name: N/A    Number of Children: 0  . 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 Smoking status: Never Smoker   . Smokeless tobacco: Never Used  . Alcohol Use: 0.6 oz/week    1 Glasses of wine per week  . Drug Use: No  . Sexual Activity: Yes    Birth Control/ Protection: Condom   Other Topics Concern  . Not on file   Social History Narrative   Regular exercise-no   Caffeine Use-yes      Constitutional: Denies fever, malaise, fatigue, headache or abrupt weight changes.  Respiratory: Denies difficulty breathing, shortness of breath, cough or sputum production.   Cardiovascular: Denies chest pain, chest tightness, palpitations or swelling in the hands or feet.  Psych: Pt reports anxiety, ? Depression. Denies SI/HI.  No other specific complaints in a complete review of systems (except as listed in HPI above).  Objective:   Physical Exam   BP 120/72  Pulse 102  Temp(Src) 98.3 F (36.8 C) (Oral)  Wt 184 lb (83.462 kg)  SpO2 99% Wt Readings from Last 3 Encounters:  08/21/13 184 lb (83.462 kg)  02/10/13 193 lb 8 oz (87.771 kg)  11/11/12 188 lb 1.9 oz (85.331 kg)    General: Appears her stated age, well developed, well nourished in NAD. Cardiovascular: Normal rate and rhythm. S1,S2 noted.  No murmur, rubs or gallops noted. No JVD or BLE edema. No carotid bruits noted. Pulmonary/Chest: Normal effort and positive vesicular breath sounds. No respiratory distress. No wheezes, rales or ronchi noted.  Psychiatric: Mood anxious and affect normal. Behavior is normal. Judgment and thought content normal.     BMET    Component Value Date/Time   NA 138 08/15/2012 1613   K 3.7 08/15/2012 1613  CL 105 08/15/2012 1613   CO2 25 08/15/2012 1613   GLUCOSE 84 08/15/2012 1613   BUN 5* 08/15/2012 1613   CREATININE 0.7 08/15/2012 1613   CALCIUM 9.2 08/15/2012 1613    Lipid Panel     Component Value Date/Time   CHOL 209* 12/21/2011 0916   TRIG 58.0 12/21/2011 0916   HDL 75.30 12/21/2011 0916   CHOLHDL 3 12/21/2011 0916   VLDL 11.6 12/21/2011 0916    CBC    Component Value Date/Time   WBC 4.6 12/21/2011 0916   RBC 4.45 12/21/2011 0916   HGB 14.0 12/21/2011 0916   HCT 40.1 12/21/2011 0916   PLT 392.0 12/21/2011 0916   MCV 90.2 12/21/2011 0916   MCHC 34.9 12/21/2011 0916   RDW 12.8 12/21/2011 0916    Hgb A1C Lab Results  Component Value Date   HGBA1C 5.5 12/21/2011         Assessment & Plan:

## 2013-08-21 NOTE — Assessment & Plan Note (Signed)
Well controlled on current dose of norvasc and HCTZ She is working on healthy lifestyle changes Will check CBC and CMET today

## 2013-08-21 NOTE — Progress Notes (Signed)
Pre visit review using our clinic review tool, if applicable. No additional management support is needed unless otherwise documented below in the visit note. 

## 2013-08-21 NOTE — Patient Instructions (Addendum)

## 2013-08-24 ENCOUNTER — Other Ambulatory Visit: Payer: Self-pay

## 2013-08-24 DIAGNOSIS — E876 Hypokalemia: Secondary | ICD-10-CM

## 2013-08-24 MED ORDER — POTASSIUM CHLORIDE ER 10 MEQ PO TBCR: 10.0000 meq | EXTENDED_RELEASE_TABLET | Freq: Every day | ORAL | Status: DC

## 2013-08-27 ENCOUNTER — Encounter: Payer: Self-pay | Admitting: Internal Medicine

## 2013-09-13 ENCOUNTER — Encounter: Payer: Self-pay | Admitting: Internal Medicine

## 2013-09-16 ENCOUNTER — Other Ambulatory Visit: Payer: 59

## 2013-10-02 ENCOUNTER — Encounter: Payer: Self-pay | Admitting: Internal Medicine

## 2013-10-02 ENCOUNTER — Ambulatory Visit (INDEPENDENT_AMBULATORY_CARE_PROVIDER_SITE_OTHER): Payer: 59 | Admitting: Internal Medicine

## 2013-10-02 VITALS — BP 126/78 | HR 78 | Temp 98.5°F | Wt 183.0 lb

## 2013-10-02 DIAGNOSIS — F419 Anxiety disorder, unspecified: Principal | ICD-10-CM

## 2013-10-02 DIAGNOSIS — F329 Major depressive disorder, single episode, unspecified: Secondary | ICD-10-CM

## 2013-10-02 DIAGNOSIS — Z3009 Encounter for other general counseling and advice on contraception: Secondary | ICD-10-CM

## 2013-10-02 DIAGNOSIS — F341 Dysthymic disorder: Secondary | ICD-10-CM

## 2013-10-02 DIAGNOSIS — F32A Depression, unspecified: Secondary | ICD-10-CM

## 2013-10-02 MED ORDER — ALPRAZOLAM 0.25 MG PO TABS: 0.2500 mg | ORAL_TABLET | Freq: Two times a day (BID) | ORAL | Status: DC | PRN

## 2013-10-02 NOTE — Assessment & Plan Note (Addendum)
Reports no improvement in anxiety since starting prozac She has stopped it as it made her feel worse Will start low dose xanax 0.25 mg BID prn Will obtain CSA and UDS today

## 2013-10-02 NOTE — Patient Instructions (Addendum)

## 2013-10-02 NOTE — Progress Notes (Signed)
Pre visit review using our clinic review tool, if applicable. No additional management support is needed unless otherwise documented below in the visit note. 

## 2013-10-02 NOTE — Progress Notes (Signed)
HPI:  Pt presents today to follow up regarding anxiety. She was started on Prozac 20mg  daily in August. She took the medication for two weeks and stated it made her feel worse. She became sad with increased period of feeling down. She denies any thoughts of suicide. She states her anxiety is intermittent, maybe threes times a week at different times. The episodes do not last long, but they can be difficult to come down from.   She also wishes to discuss starting Depo injections for birth control. She is currently sexually active and uses condoms as birth control. She has used Depo before without any complications. She does deny any concerns or questions regarding the injection. Her last period was 08/30/13 and she is due to start her cycle this week.    Past Medical History  Diagnosis Date  . Allergy   . Hypertension     Current Outpatient Prescriptions  Medication Sig Dispense Refill  . amLODipine (NORVASC) 10 MG tablet TAKE 1 TABLET BY MOUTH ONCE DAILY  30 tablet  PRN  . FLUoxetine (PROZAC) 20 MG tablet Take 1 tablet (20 mg total) by mouth daily.  30 tablet  3  . hydrochlorothiazide (HYDRODIURIL) 25 MG tablet TAKE 1 TABLET BY MOUTH ONCE DAILY  30 tablet  PRN  . potassium chloride (K-DUR) 10 MEQ tablet Take 1 tablet (10 mEq total) by mouth daily.  30 tablet  1   No current facility-administered medications for this visit.    No Known Allergies  Family History  Problem Relation Age of Onset  . Hypertension Mother   . Diabetes Father   . Anuerysm Maternal Aunt   . Stroke Maternal Grandmother   . Cancer Neg Hx   . Early death Neg Hx     History   Social History  . Marital Status: Single    Spouse Name: N/A    Number of Children: 0  . 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 Smoking status: Never Smoker   . Smokeless tobacco: Never Used  . Alcohol Use: 0.6 oz/week    1 Glasses of wine per week  . Drug Use: No  .  Sexual Activity: Yes    Birth Control/ Protection: Condom   Other Topics Concern  . Not on file   Social History Narrative   Regular exercise-no   Caffeine Use-yes    ROS:  Constitutional: Denies fever, malaise, fatigue, headache or abrupt weight changes.  Respiratory: Denies difficulty breathing, shortness of breath  Cardiovascular: Denies chest pain, chest tightness, palpitations or swelling in the hands or feet.  Neurological: Denies dizziness, difficulty with memory, difficulty with speech or problems with balance and coordination.  Psych: Positive anxiety;  Denies SI, depression, or mood disorder  No other specific complaints in a complete review of systems (except as listed in HPI above).  PE:  Wt 183 lb (83.008 kg)  LMP 09/01/2013 Wt Readings from Last 3 Encounters:  10/02/13 183 lb (83.008 kg)  08/21/13 184 lb (83.462 kg)  02/10/13 193 lb 8 oz (87.771 kg)    General: Appears their stated age, well developed, well nourished in NAD. Cardiovascular: Normal rate and rhythm. S1,S2 noted.  No murmur, rubs or gallops noted. No JVD or BLE edema. No carotid bruits noted. Pulmonary/Chest: Normal effort and positive vesicular breath sounds. No respiratory distress. No wheezes, rales or ronchi noted.  Psychiatric: Mood and affect normal. Behavior is normal.  Judgment and thought content normal.   EKG:  BMET    Component Value Date/Time   NA 134* 08/21/2013 1421   K 3.0* 08/21/2013 1421   CL 99 08/21/2013 1421   CO2 25 08/21/2013 1421   GLUCOSE 92 08/21/2013 1421   BUN 9 08/21/2013 1421   CREATININE 0.8 08/21/2013 1421   CALCIUM 9.5 08/21/2013 1421    Lipid Panel     Component Value Date/Time   CHOL 209* 12/21/2011 0916   TRIG 58.0 12/21/2011 0916   HDL 75.30 12/21/2011 0916   CHOLHDL 3 12/21/2011 0916   VLDL 11.6 12/21/2011 0916    CBC    Component Value Date/Time   WBC 5.9 08/21/2013 1421   RBC 4.38 08/21/2013 1421   HGB 12.8 08/21/2013 1421   HCT 38.2 08/21/2013  1421   PLT 302.0 08/21/2013 1421   MCV 87.3 08/21/2013 1421   MCHC 33.4 08/21/2013 1421   RDW 15.3 08/21/2013 1421    Hgb A1C Lab Results  Component Value Date   HGBA1C 5.5 12/21/2011     Assessment and Plan: 1. Anxiety: Will stop prozac Start Xanax 0.25mg  PO BID prn  Follow up if needed if symptoms do not improve  2. Contraception Will return next week after starting her cycle Will take urine pregnancy test Start Depo injections, if negative test Return every 3 months for injection  Leya Paige S, Student-NP

## 2013-10-02 NOTE — Progress Notes (Signed)
Subjective:    Patient ID: Rebekah Irwin, female    DOB: Nov 16, 1975, 38 y.o.   MRN: [id number]  HPI  Pt presents to the clinic today to discuss trying the Depo injection for birth control. She has used Depo in the past. She is not currently using anything for birth control. She is sexually active. Her LMP was 09/01/13.  Additionally, she does have some concerns regarding her anxiety. She reports that the prozac has not helped at all with her symptoms. She reports that it actually made her feel worse, more depressed. She reports that she does not feel like it is a mood disorder, she just feels anxious on an occasional basis.  Review of Systems      Past Medical History  Diagnosis Date  . Allergy   . Hypertension     Current Outpatient Prescriptions  Medication Sig Dispense Refill  . amLODipine (NORVASC) 10 MG tablet TAKE 1 TABLET BY MOUTH ONCE DAILY  30 tablet  PRN  . FLUoxetine (PROZAC) 20 MG tablet Take 1 tablet (20 mg total) by mouth daily.  30 tablet  3  . hydrochlorothiazide (HYDRODIURIL) 25 MG tablet TAKE 1 TABLET BY MOUTH ONCE DAILY  30 tablet  PRN  . potassium chloride (K-DUR) 10 MEQ tablet Take 1 tablet (10 mEq total) by mouth daily.  30 tablet  1   No current facility-administered medications for this visit.    No Known Allergies  Family History  Problem Relation Age of Onset  . Hypertension Mother   . Diabetes Father   . Anuerysm Maternal Aunt   . Stroke Maternal Grandmother   . Cancer Neg Hx   . Early death Neg Hx     History   Social History  . Marital Status: Single    Spouse Name: N/A    Number of Children: 0  . 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 Smoking status: Never Smoker   . Smokeless tobacco: Never Used  . Alcohol Use: 0.6 oz/week    1 Glasses of wine per week  . Drug Use: No  . Sexual Activity: Yes    Birth Control/ Protection: Condom   Other Topics Concern  . Not on  file   Social History Narrative   Regular exercise-no   Caffeine Use-yes     Constitutional: Denies fever, malaise, fatigue, headache or abrupt weight changes.  Gastrointestinal: Denies abdominal pain, bloating, constipation, diarrhea or blood in the stool.  GU: Denies urgency, frequency, pain with urination, burning sensation, blood in urine, odor or discharge.  Psych: Pt reports anxiety. Denies depression, SI/HI.  No other specific complaints in a complete review of systems (except as listed in HPI above).  Objective:   Physical Exam   BP 126/78  Pulse 78  Temp(Src) 98.5 F (36.9 C) (Oral)  Wt 183 lb (83.008 kg)  SpO2 98%  LMP 09/01/2013 Wt Readings from Last 3 Encounters:  10/02/13 183 lb (83.008 kg)  08/21/13 184 lb (83.462 kg)  02/10/13 193 lb 8 oz (87.771 kg)    General: Appears her stated age, well developed, well nourished in NAD. Cardiovascular: Normal rate and rhythm. S1,S2 noted.  No murmur, rubs or gallops noted.  Pulmonary/Chest: Normal effort and positive vesicular breath sounds. No respiratory distress. No wheezes, rales or ronchi noted.  Abdomen: Soft and nontender. Normal bowel sounds, no bruits noted. No distention or masses noted. Liver, spleen and kidneys  non palpable. Psychiatric: Mood and affect normal. Behavior is normal. Judgment and thought content normal.     BMET    Component Value Date/Time   NA 134* 08/21/2013 1421   K 3.0* 08/21/2013 1421   CL 99 08/21/2013 1421   CO2 25 08/21/2013 1421   GLUCOSE 92 08/21/2013 1421   BUN 9 08/21/2013 1421   CREATININE 0.8 08/21/2013 1421   CALCIUM 9.5 08/21/2013 1421    Lipid Panel     Component Value Date/Time   CHOL 209* 12/21/2011 0916   TRIG 58.0 12/21/2011 0916   HDL 75.30 12/21/2011 0916   CHOLHDL 3 12/21/2011 0916   VLDL 11.6 12/21/2011 0916    CBC    Component Value Date/Time   WBC 5.9 08/21/2013 1421   RBC 4.38 08/21/2013 1421   HGB 12.8 08/21/2013 1421   HCT 38.2 08/21/2013 1421   PLT  302.0 08/21/2013 1421   MCV 87.3 08/21/2013 1421   MCHC 33.4 08/21/2013 1421   RDW 15.3 08/21/2013 1421    Hgb A1C Lab Results  Component Value Date   HGBA1C 5.5 12/21/2011        Assessment & Plan:  Counseling for Contraceptive Use:   Make appointment for 1 week after the start of your period to receive urine pregnancy/Depo Provera injection Advised her that she must come back every 90 days to receive injection Discussed risk and benefits of this particular birth control, pt aware may cause bone loss if used for extended period of time  RTC as needed

## 2013-10-27 ENCOUNTER — Other Ambulatory Visit: Payer: Self-pay | Admitting: Internal Medicine

## 2013-10-28 ENCOUNTER — Other Ambulatory Visit: Payer: Self-pay | Admitting: Internal Medicine

## 2013-10-28 MED ORDER — ALPRAZOLAM 0.25 MG PO TABS: 0.2500 mg | ORAL_TABLET | Freq: Two times a day (BID) | ORAL | Status: DC | PRN

## 2013-10-28 NOTE — Telephone Encounter (Signed)
Ok to phone in xanax to be filled on or after 10/25

## 2013-10-28 NOTE — Telephone Encounter (Signed)
Rx called in to pharmacy. 

## 2013-10-28 NOTE — Telephone Encounter (Signed)
Last filled 10/02/13--please advise if okay to call in to fill on or after 11/01/2013

## 2013-10-30 NOTE — Telephone Encounter (Signed)
Rx called in to pharmacy. 

## 2013-11-05 ENCOUNTER — Telehealth: Payer: Self-pay

## 2013-11-05 ENCOUNTER — Encounter: Payer: Self-pay | Admitting: Internal Medicine

## 2013-11-05 NOTE — Telephone Encounter (Signed)
Pt left v/m requesting nurse visit to start Depo shot; not on med list; when do you want pt to start Depo shot and will pt have to have negative pregnancy test x 2 prior to starting injections.Please advise.

## 2013-11-06 NOTE — Telephone Encounter (Signed)
We can start this anytime, negative urine preg x 1 as she just had her period.

## 2013-11-06 NOTE — Telephone Encounter (Signed)
Left message on voicemail Pt can make nurse visit for Depo inj and have pregnancy before injection

## 2014-01-07 ENCOUNTER — Other Ambulatory Visit: Payer: Self-pay | Admitting: Internal Medicine

## 2014-01-07 MED ORDER — ALPRAZOLAM 0.25 MG PO TABS: 0.2500 mg | ORAL_TABLET | Freq: Two times a day (BID) | ORAL | Status: DC | PRN

## 2014-01-11 ENCOUNTER — Other Ambulatory Visit: Payer: Self-pay | Admitting: Internal Medicine

## 2014-01-11 ENCOUNTER — Encounter: Payer: Self-pay | Admitting: Internal Medicine

## 2014-01-11 MED ORDER — OLOPATADINE HCL 0.2 % OP SOLN: 1.0000 [drp] | OPHTHALMIC | Status: DC

## 2014-01-27 ENCOUNTER — Encounter: Payer: Self-pay | Admitting: Internal Medicine

## 2014-02-03 ENCOUNTER — Encounter: Payer: Self-pay | Admitting: Internal Medicine

## 2014-02-03 ENCOUNTER — Other Ambulatory Visit: Payer: Self-pay | Admitting: Internal Medicine

## 2014-02-03 MED ORDER — OLOPATADINE HCL 0.1 % OP SOLN: 1.0000 [drp] | Freq: Two times a day (BID) | OPHTHALMIC | Status: DC

## 2014-02-05 ENCOUNTER — Other Ambulatory Visit: Payer: Self-pay

## 2014-02-05 ENCOUNTER — Encounter: Payer: Self-pay | Admitting: Internal Medicine

## 2014-02-05 MED ORDER — ALPRAZOLAM 0.25 MG PO TABS: 0.2500 mg | ORAL_TABLET | Freq: Two times a day (BID) | ORAL | Status: DC | PRN

## 2014-02-05 MED ORDER — ALPRAZOLAM 0.25 MG PO TABS: 0.2500 mg | ORAL_TABLET | Freq: Two times a day (BID) | ORAL | Status: DC

## 2014-02-05 NOTE — Telephone Encounter (Signed)
Rx called into pharmacy as directed--pt is aware that she will need to come in to fill out controlled substance contract

## 2014-02-05 NOTE — Addendum Note (Signed)
Addended by: Lurlean Nanny on: 02/05/2014 11:38 AM   Modules accepted: Orders

## 2014-02-16 ENCOUNTER — Other Ambulatory Visit: Payer: Self-pay | Admitting: Internal Medicine

## 2014-03-22 ENCOUNTER — Other Ambulatory Visit: Payer: Self-pay | Admitting: Internal Medicine

## 2014-03-22 NOTE — Telephone Encounter (Signed)
Last filled 02/07/2014--please advise

## 2014-03-23 ENCOUNTER — Encounter: Payer: Self-pay | Admitting: Internal Medicine

## 2014-03-23 ENCOUNTER — Other Ambulatory Visit: Payer: Self-pay | Admitting: Internal Medicine

## 2014-03-23 NOTE — Telephone Encounter (Signed)
Ok to phone in xanax 

## 2014-03-23 NOTE — Telephone Encounter (Signed)
Rx called in to pharmacy. 

## 2014-04-14 ENCOUNTER — Encounter: Payer: Self-pay | Admitting: Internal Medicine

## 2014-04-16 ENCOUNTER — Telehealth: Payer: Self-pay | Admitting: Internal Medicine

## 2014-04-16 NOTE — Telephone Encounter (Signed)
come in for a nurse only visit for a urine pregnancy test now and again in 4 weeks. If both are negative, she can have Depo shot at that visit.

## 2014-04-16 NOTE — Telephone Encounter (Signed)
Scheduled Urine preg test for Monday 04/19/14

## 2014-04-16 NOTE — Telephone Encounter (Signed)
Tried call pt mail box full

## 2014-04-16 NOTE — Telephone Encounter (Signed)
Pt called to schedule to start her depo shots again Her period stop 04/14/14 Is it ok to schedule and what date should she come in?

## 2014-04-19 ENCOUNTER — Other Ambulatory Visit: Payer: 59

## 2014-04-23 ENCOUNTER — Ambulatory Visit (INDEPENDENT_AMBULATORY_CARE_PROVIDER_SITE_OTHER): Payer: 59

## 2014-04-23 DIAGNOSIS — Z3200 Encounter for pregnancy test, result unknown: Secondary | ICD-10-CM

## 2014-04-23 LAB — POCT URINE PREGNANCY: Preg Test, Ur: NEGATIVE

## 2014-04-26 ENCOUNTER — Telehealth: Payer: Self-pay | Admitting: Internal Medicine

## 2014-04-26 ENCOUNTER — Other Ambulatory Visit: Payer: Self-pay | Admitting: Internal Medicine

## 2014-04-26 MED ORDER — ALPRAZOLAM 0.25 MG PO TABS: 0.2500 mg | ORAL_TABLET | Freq: Two times a day (BID) | ORAL | Status: DC | PRN

## 2014-04-26 NOTE — Telephone Encounter (Signed)
Rx called in to pharmacy. 

## 2014-04-26 NOTE — Addendum Note (Signed)
Addended by: Jearld Fenton on: 04/26/2014 09:31 AM   Modules accepted: Orders

## 2014-04-26 NOTE — Telephone Encounter (Signed)
Please phone in xanax. I don't see a CSA or UDS in the system

## 2014-05-07 ENCOUNTER — Telehealth: Payer: Self-pay | Admitting: *Deleted

## 2014-05-07 NOTE — Telephone Encounter (Signed)
Pt made appt for 05/18/2014 and I will do it as there were no more morning slots available

## 2014-05-07 NOTE — Telephone Encounter (Signed)
Patient was here for a nurse visit on 04/17 for pregnancy test for Depo-Provera.  She says she was told to come back in after starting her next menstrual cycle for another pregnancy test and to get the shot.  She is on her menses now and has scheduled a nurse appointment for 05/17.  She is calling to be sure that it is okay to wait until then.  Please advise?

## 2014-05-07 NOTE — Telephone Encounter (Signed)
It would be better if she could come in the week of 5/9/16d

## 2014-05-18 ENCOUNTER — Ambulatory Visit (INDEPENDENT_AMBULATORY_CARE_PROVIDER_SITE_OTHER): Payer: 59

## 2014-05-18 DIAGNOSIS — Z30013 Encounter for initial prescription of injectable contraceptive: Secondary | ICD-10-CM | POA: Diagnosis not present

## 2014-05-18 LAB — POCT URINE PREGNANCY: Preg Test, Ur: NEGATIVE

## 2014-05-18 MED ORDER — MEDROXYPROGESTERONE ACETATE 150 MG/ML IM SUSP: 150.0000 mg | INTRAMUSCULAR | Status: DC

## 2014-05-18 MED ORDER — MEDROXYPROGESTERONE ACETATE 150 MG/ML IM SUSP
150.0000 mg | Freq: Once | INTRAMUSCULAR | Status: AC
  Administered 2014-05-18: 150 mg via INTRAMUSCULAR

## 2014-05-19 ENCOUNTER — Ambulatory Visit: Payer: 59

## 2014-05-21 ENCOUNTER — Ambulatory Visit: Payer: 59

## 2014-05-25 ENCOUNTER — Ambulatory Visit: Payer: 59

## 2014-05-28 ENCOUNTER — Other Ambulatory Visit: Payer: Self-pay | Admitting: Internal Medicine

## 2014-05-31 ENCOUNTER — Other Ambulatory Visit: Payer: Self-pay

## 2014-05-31 MED ORDER — ALPRAZOLAM 0.25 MG PO TABS: 0.2500 mg | ORAL_TABLET | Freq: Two times a day (BID) | ORAL | Status: DC | PRN

## 2014-06-01 ENCOUNTER — Other Ambulatory Visit: Payer: Self-pay | Admitting: Internal Medicine

## 2014-08-13 ENCOUNTER — Telehealth: Payer: Self-pay

## 2014-08-13 NOTE — Telephone Encounter (Signed)
Pt called front desk to schedule 3 month depo injection;last depo inj 05/18/14 and pt has until 08/17/14. Offered several appt options for nurse visit and pt was not sure if she could come in and would not schedule appt. Pt said she would have to cb.

## 2014-10-20 ENCOUNTER — Telehealth: Payer: Self-pay | Admitting: Internal Medicine

## 2014-10-20 ENCOUNTER — Other Ambulatory Visit: Payer: Self-pay | Admitting: Internal Medicine

## 2014-10-20 ENCOUNTER — Ambulatory Visit (INDEPENDENT_AMBULATORY_CARE_PROVIDER_SITE_OTHER): Payer: 59 | Admitting: Internal Medicine

## 2014-10-20 ENCOUNTER — Encounter: Payer: Self-pay | Admitting: Internal Medicine

## 2014-10-20 VITALS — BP 130/80 | HR 127 | Temp 98.4°F | Wt 202.0 lb

## 2014-10-20 DIAGNOSIS — Z3201 Encounter for pregnancy test, result positive: Secondary | ICD-10-CM

## 2014-10-20 DIAGNOSIS — N912 Amenorrhea, unspecified: Secondary | ICD-10-CM | POA: Diagnosis not present

## 2014-10-20 LAB — POCT URINE PREGNANCY: Preg Test, Ur: POSITIVE — AB

## 2014-10-20 NOTE — Telephone Encounter (Signed)
[facility] Patient Name: Rebekah Irwin DOB: Sep 06, 1975 Initial Comment Caller states, has questions about coming off the Depro shot ? Her last one was May 10th, she has not had a period yet. Nurse Assessment Guidelines Guideline Title Affirmed Question Affirmed Notes Final Disposition User Clinical Call Glandorf, RN, Amy Comments SHE GOT THE DEPO SHOT AND SHE DID NOT GO BACK AND GET THE NEXT INJECTION IN [place]. SHE HAS NOT HAD PERIOD. SHE DID GAIN WEIGHT. SHE WAS WANDERING. IF SHE COULD BE PREGNANT AND WHEN WILL SHE START HER PERIOD. SHE HAS AN APPT WITH DR. BAITY THIS AFTERNOON AT 3 AND SHE JUST WANTED TO KNOW IF SHE NEEDED TO COME IN OR NOT. TRIAGE NURSE DID GO OVER WITH HER THE INFORMATION ON THE DEPO SHOT THAT IT CAN TAKE TIME FOR HER TO RESTART HER PERIOD BACK TO NORMAL ONCE TAKING THE INJECTION AND THAT ALWAYS THAT SMALL CHANCE SHE COULD BE PREGNANT BUT UNLIKELY. CAN NEVER ASSUME THAT SHE IS NOT. THAT SHE DEFINITELY NEEDS TO KEEP HER APPT AND SPEAK TO MD TODAY REGARDING HER CONCERNS AND SHE ALSO WANTED TO KNOW ABOUT IUD OPTIONS AND IF THEY CAN DO THEM IN THE OFFICE OR NOT. REFERRED HER TO THE MD FOR THIS.

## 2014-10-20 NOTE — Patient Instructions (Signed)
First Trimester of Pregnancy The first trimester of pregnancy is from week 1 until the end of week 12 (months 1 through 3). A week after a sperm fertilizes an egg, the egg will implant on the wall of the uterus. This embryo will begin to develop into a baby. Genes from you and your partner are forming the baby. The female genes determine whether the baby is a boy or a girl. At 6-8 weeks, the eyes and face are formed, and the heartbeat can be seen on ultrasound. At the end of 12 weeks, all the baby's organs are formed.  Now that you are pregnant, you will want to do everything you can to have a healthy baby. Two of the most important things are to get good prenatal care and to follow your health care provider's instructions. Prenatal care is all the medical care you receive before the baby's birth. This care will help prevent, find, and treat any problems during the pregnancy and childbirth. BODY CHANGES Your body goes through many changes during pregnancy. The changes vary from woman to woman.   You may gain or lose a couple of pounds at first.  You may feel sick to your stomach (nauseous) and throw up (vomit). If the vomiting is uncontrollable, call your health care provider.  You may tire easily.  You may develop headaches that can be relieved by medicines approved by your health care provider.  You may urinate more often. Painful urination may mean you have a bladder infection.  You may develop heartburn as a result of your pregnancy.  You may develop constipation because certain hormones are causing the muscles that push waste through your intestines to slow down.  You may develop hemorrhoids or swollen, bulging veins (varicose veins).  Your breasts may begin to grow larger and become tender. Your nipples may stick out more, and the tissue that surrounds them (areola) may become darker.  Your gums may bleed and may be sensitive to brushing and flossing.  Dark spots or blotches (chloasma,  mask of pregnancy) may develop on your face. This will likely fade after the baby is born.  Your menstrual periods will stop.  You may have a loss of appetite.  You may develop cravings for certain kinds of food.  You may have changes in your emotions from day to day, such as being excited to be pregnant or being concerned that something may go wrong with the pregnancy and baby.  You may have more vivid and strange dreams.  You may have changes in your hair. These can include thickening of your hair, rapid growth, and changes in texture. Some women also have hair loss during or after pregnancy, or hair that feels dry or thin. Your hair will most likely return to normal after your baby is born. WHAT TO EXPECT AT YOUR PRENATAL VISITS During a routine prenatal visit:  You will be weighed to make sure you and the baby are growing normally.  Your blood pressure will be taken.  Your abdomen will be measured to track your baby's growth.  The fetal heartbeat will be listened to starting around week 10 or 12 of your pregnancy.  Test results from any previous visits will be discussed. Your health care provider may ask you:  How you are feeling.  If you are feeling the baby move.  If you have had any abnormal symptoms, such as leaking fluid, bleeding, severe headaches, or abdominal cramping.  If you are using any tobacco products,   including cigarettes, chewing tobacco, and electronic cigarettes.  If you have any questions. Other tests that may be performed during your first trimester include:  Blood tests to find your blood type and to check for the presence of any previous infections. They will also be used to check for low iron levels (anemia) and Rh antibodies. Later in the pregnancy, blood tests for diabetes will be done along with other tests if problems develop.  Urine tests to check for infections, diabetes, or protein in the urine.  An ultrasound to confirm the proper growth  and development of the baby.  An amniocentesis to check for possible genetic problems.  Fetal screens for spina bifida and Down syndrome.  You may need other tests to make sure you and the baby are doing well.  HIV (human immunodeficiency virus) testing. Routine prenatal testing includes screening for HIV, unless you choose not to have this test. HOME CARE INSTRUCTIONS  Medicines  Follow your health care provider's instructions regarding medicine use. Specific medicines may be either safe or unsafe to take during pregnancy.  Take your prenatal vitamins as directed.  If you develop constipation, try taking a stool softener if your health care provider approves. Diet  Eat regular, well-balanced meals. Choose a variety of foods, such as meat or vegetable-based protein, fish, milk and low-fat dairy products, vegetables, fruits, and whole grain breads and cereals. Your health care provider will help you determine the amount of weight gain that is right for you.  Avoid raw meat and uncooked cheese. These carry germs that can cause birth defects in the baby.  Eating four or five small meals rather than three large meals a day may help relieve nausea and vomiting. If you start to feel nauseous, eating a few soda crackers can be helpful. Drinking liquids between meals instead of during meals also seems to help nausea and vomiting.  If you develop constipation, eat more high-fiber foods, such as fresh vegetables or fruit and whole grains. Drink enough fluids to keep your urine clear or pale yellow. Activity and Exercise  Exercise only as directed by your health care provider. Exercising will help you:  Control your weight.  Stay in shape.  Be prepared for labor and delivery.  Experiencing pain or cramping in the lower abdomen or low back is a good sign that you should stop exercising. Check with your health care provider before continuing normal exercises.  Try to avoid standing for long  periods of time. Move your legs often if you must stand in one place for a long time.  Avoid heavy lifting.  Wear low-heeled shoes, and practice good posture.  You may continue to have sex unless your health care provider directs you otherwise. Relief of Pain or Discomfort  Wear a good support bra for breast tenderness.   Take warm sitz baths to soothe any pain or discomfort caused by hemorrhoids. Use hemorrhoid cream if your health care provider approves.   Rest with your legs elevated if you have leg cramps or low back pain.  If you develop varicose veins in your legs, wear support hose. Elevate your feet for 15 minutes, 3-4 times a day. Limit salt in your diet. Prenatal Care  Schedule your prenatal visits by the twelfth week of pregnancy. They are usually scheduled monthly at first, then more often in the last 2 months before delivery.  Write down your questions. Take them to your prenatal visits.  Keep all your prenatal visits as directed by your   health care provider. Safety  Wear your seat belt at all times when driving.  Make a list of emergency phone numbers, including numbers for family, friends, the hospital, and police and fire departments. General Tips  Ask your health care provider for a referral to a local prenatal education class. Begin classes no later than at the beginning of month 6 of your pregnancy.  Ask for help if you have counseling or nutritional needs during pregnancy. Your health care provider can offer advice or refer you to specialists for help with various needs.  Do not use hot tubs, steam rooms, or saunas.  Do not douche or use tampons or scented sanitary pads.  Do not cross your legs for long periods of time.  Avoid cat litter boxes and soil used by cats. These carry germs that can cause birth defects in the baby and possibly loss of the fetus by miscarriage or stillbirth.  Avoid all smoking, herbs, alcohol, and medicines not prescribed by  your health care provider. Chemicals in these affect the formation and growth of the baby.  Do not use any tobacco products, including cigarettes, chewing tobacco, and electronic cigarettes. If you need help quitting, ask your health care provider. You may receive counseling support and other resources to help you quit.  Schedule a dentist appointment. At home, brush your teeth with a soft toothbrush and be gentle when you floss. SEEK MEDICAL CARE IF:   You have dizziness.  You have mild pelvic cramps, pelvic pressure, or nagging pain in the abdominal area.  You have persistent nausea, vomiting, or diarrhea.  You have a bad smelling vaginal discharge.  You have pain with urination.  You notice increased swelling in your face, hands, legs, or ankles. SEEK IMMEDIATE MEDICAL CARE IF:   You have a fever.  You are leaking fluid from your vagina.  You have spotting or bleeding from your vagina.  You have severe abdominal cramping or pain.  You have rapid weight gain or loss.  You vomit blood or material that looks like coffee grounds.  You are exposed to German measles and have never had them.  You are exposed to fifth disease or chickenpox.  You develop a severe headache.  You have shortness of breath.  You have any kind of trauma, such as from a fall or a car accident.   This information is not intended to replace advice given to you by your health care provider. Make sure you discuss any questions you have with your health care provider.   Document Released: 12/19/2000 Document Revised: 01/15/2014 Document Reviewed: 11/04/2012 Elsevier Interactive Patient Education 2016 [employer]  

## 2014-10-20 NOTE — Telephone Encounter (Signed)
Pt has appt with Webb Silversmith NP on 10/20/14 at 3 pm.

## 2014-10-20 NOTE — Progress Notes (Signed)
Subjective:    Patient ID: Rebekah Irwin, female    DOB: Jun 30, 1975, 39 y.o.   MRN: [id number]  HPI  Pt presents to the clinic today to discuss her missed period. She reports she was on Depo, she got her first shot in 05/2014. She decided to not continue it, because she felt like it made her gain too much weight. She has had 2 sexual encounters since she stopped the Depo. She only wore protection in one of those encounters. Her LMP was in May. She denies nausea, breast tenderness, frequent urination, etc.     Review of Systems      Past Medical History  Diagnosis Date  . Allergy   . Hypertension     Current Outpatient Prescriptions  Medication Sig Dispense Refill  . ALPRAZolam (XANAX) 0.25 MG tablet Take 1 tablet (0.25 mg total) by mouth 2 (two) times daily as needed for anxiety. 60 tablet 0  . amLODipine (NORVASC) 10 MG tablet TAKE 1 TABLET BY MOUTH ONCE DAILY 30 tablet 3  . hydrochlorothiazide (HYDRODIURIL) 25 MG tablet TAKE 1 TABLET BY MOUTH ONCE DAILY 30 tablet 3  . medroxyPROGESTERone (DEPO-PROVERA) 150 MG/ML injection Inject 1 mL (150 mg total) into the muscle every 3 (three) months. 1 mL 0  . olopatadine (PATANOL) 0.1 % ophthalmic solution Place 1 drop into both eyes 2 (two) times daily. 5 mL 12  . potassium chloride (K-DUR) 10 MEQ tablet Take 1 tablet (10 mEq total) by mouth daily. 30 tablet 1   No current facility-administered medications for this visit.    No Known Allergies  Family History  Problem Relation Age of Onset  . Hypertension Mother   . Diabetes Father   . Anuerysm Maternal Aunt   . Stroke Maternal Grandmother   . Cancer Neg Hx   . Early death Neg Hx     Social History   Social History  . Marital Status: Single    Spouse Name: N/A  . Number of Children: 0  . 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 Smoking status: Never Smoker   . Smokeless tobacco: Never Used  . Alcohol Use:  0.6 oz/week    1 Glasses of wine per week  . Drug Use: No  . Sexual Activity: Yes    Birth Control/ Protection: Condom   Other Topics Concern  . Not on file   Social History Narrative   Regular exercise-no   Caffeine Use-yes     Constitutional: Denies fever, malaise, fatigue, headache or abrupt weight changes.  Respiratory: Denies difficulty breathing, shortness of breath, cough or sputum production.   Cardiovascular: Denies chest pain, chest tightness, palpitations or swelling in the hands or feet.  Gastrointestinal: Denies abdominal pain, bloating, constipation, diarrhea or blood in the stool.  GU: Pt reports amenorrhea. Denies urgency, frequency, pain with urination, burning sensation, blood in urine, odor or discharge.  No other specific complaints in a complete review of systems (except as listed in HPI above).  Objective:   Physical Exam   BP 130/80 mmHg  Pulse 127  Temp(Src) 98.4 F (36.9 C) (Tympanic)  Wt 202 lb (91.627 kg)  SpO2 97% Wt Readings from Last 3 Encounters:  10/20/14 202 lb (91.627 kg)  10/02/13 183 lb (83.008 kg)  08/21/13 184 lb (83.462 kg)    General: Appears her stated age, obese in NAD.  Cardiovascular: Tachycardic with normal rhythm. S1,S2 noted.  No murmur, rubs or gallops noted.  Pulmonary/Chest: Normal effort and positive vesicular breath sounds. No respiratory distress. No wheezes, rales or ronchi noted.  Abdomen: Soft and nontender. Uterine fundus not palpable.  BMET    Component Value Date/Time   NA 134* 08/21/2013 1421   K 3.0* 08/21/2013 1421   CL 99 08/21/2013 1421   CO2 25 08/21/2013 1421   GLUCOSE 92 08/21/2013 1421   BUN 9 08/21/2013 1421   CREATININE 0.8 08/21/2013 1421   CALCIUM 9.5 08/21/2013 1421    Lipid Panel     Component Value Date/Time   CHOL 209* 12/21/2011 0916   TRIG 58.0 12/21/2011 0916   HDL 75.30 12/21/2011 0916   CHOLHDL 3 12/21/2011 0916   VLDL 11.6 12/21/2011 0916    CBC    Component Value  Date/Time   WBC 5.9 08/21/2013 1421   RBC 4.38 08/21/2013 1421   HGB 12.8 08/21/2013 1421   HCT 38.2 08/21/2013 1421   PLT 302.0 08/21/2013 1421   MCV 87.3 08/21/2013 1421   MCHC 33.4 08/21/2013 1421   RDW 15.3 08/21/2013 1421    Hgb A1C Lab Results  Component Value Date   HGBA1C 5.5 12/21/2011        Assessment & Plan:   Amenorrhea with positive pregnancy test:  Urine HCG positive She is not sure if she wants to follow through with the pregnancy or not Serum HCG today Referral placed to [facility] for transvaginal ultrasound to determine [facility] [place] her to start prenatal vitamins Handout given on info for 1st trimester of pregnancy Support given today  RTC as needed or if symptoms persist or worsen

## 2014-10-21 ENCOUNTER — Encounter: Payer: Self-pay | Admitting: Obstetrics and Gynecology

## 2014-10-21 ENCOUNTER — Ambulatory Visit (INDEPENDENT_AMBULATORY_CARE_PROVIDER_SITE_OTHER): Payer: 59 | Admitting: Obstetrics and Gynecology

## 2014-10-21 VITALS — BP 136/88 | HR 120 | Wt 196.0 lb

## 2014-10-21 DIAGNOSIS — Z3490 Encounter for supervision of normal pregnancy, unspecified, unspecified trimester: Secondary | ICD-10-CM

## 2014-10-21 DIAGNOSIS — Z3482 Encounter for supervision of other normal pregnancy, second trimester: Secondary | ICD-10-CM

## 2014-10-21 LAB — HCG, QUANTITATIVE, PREGNANCY: HCG, BETA CHAIN, QUANT, S: 25132.8 m[IU]/mL

## 2014-10-21 LAB — HCG, SERUM, QUALITATIVE: Preg, Serum: POSITIVE

## 2014-10-21 NOTE — Progress Notes (Signed)
Patient came in for dating ultrasound only and was not seen by a provider. Patient is measuring between 20-24 weeks but will be referred for an official ultrasound at [facility] hospital for dating. Patient is uncertain at this time whether she desires to keep the pregnancy but may opt to keep it if she ultrasound appears to be normal. Patient will return to initiate prenatal care

## 2014-10-22 ENCOUNTER — Encounter (HOSPITAL_COMMUNITY): Payer: Self-pay | Admitting: Student

## 2014-10-22 ENCOUNTER — Inpatient Hospital Stay (HOSPITAL_COMMUNITY)
Admission: AD | Admit: 2014-10-22 | Discharge: 2014-10-22 | Disposition: A | Discharge status: Home / Self Care | Payer: 59 | Source: Ambulatory Visit | Attending: Obstetrics & Gynecology | Admitting: Obstetrics & Gynecology

## 2014-10-22 ENCOUNTER — Inpatient Hospital Stay (HOSPITAL_COMMUNITY): Payer: 59

## 2014-10-22 DIAGNOSIS — O162 Unspecified maternal hypertension, second trimester: Secondary | ICD-10-CM | POA: Insufficient documentation

## 2014-10-22 DIAGNOSIS — O4692 Antepartum hemorrhage, unspecified, second trimester: Secondary | ICD-10-CM | POA: Diagnosis present

## 2014-10-22 DIAGNOSIS — R109 Unspecified abdominal pain: Secondary | ICD-10-CM | POA: Diagnosis not present

## 2014-10-22 DIAGNOSIS — Z79899 Other long term (current) drug therapy: Secondary | ICD-10-CM | POA: Insufficient documentation

## 2014-10-22 DIAGNOSIS — Z3A23 23 weeks gestation of pregnancy: Secondary | ICD-10-CM | POA: Diagnosis not present

## 2014-10-22 DIAGNOSIS — O26899 Other specified pregnancy related conditions, unspecified trimester: Secondary | ICD-10-CM

## 2014-10-22 DIAGNOSIS — O26892 Other specified pregnancy related conditions, second trimester: Secondary | ICD-10-CM | POA: Diagnosis not present

## 2014-10-22 DIAGNOSIS — Z87891 Personal history of nicotine dependence: Secondary | ICD-10-CM | POA: Insufficient documentation

## 2014-10-22 DIAGNOSIS — O9989 Other specified diseases and conditions complicating pregnancy, childbirth and the puerperium: Secondary | ICD-10-CM

## 2014-10-22 LAB — URINALYSIS, ROUTINE W REFLEX MICROSCOPIC
Bilirubin Urine: NEGATIVE
Glucose, UA: NEGATIVE mg/dL
Hgb urine dipstick: NEGATIVE
KETONES UR: 15 mg/dL — AB
LEUKOCYTES UA: NEGATIVE
NITRITE: NEGATIVE
PROTEIN: NEGATIVE mg/dL
Specific Gravity, Urine: 1.015 (ref 1.005–1.030)
UROBILINOGEN UA: 1 mg/dL (ref 0.0–1.0)
pH: 7 (ref 5.0–8.0)

## 2014-10-22 LAB — CBC
HEMATOCRIT: 32.4 % — AB (ref 36.0–46.0)
Hemoglobin: 11.4 g/dL — ABNORMAL LOW (ref 12.0–15.0)
MCH: 29.7 pg (ref 26.0–34.0)
MCHC: 35.2 g/dL (ref 30.0–36.0)
MCV: 84.4 fL (ref 78.0–100.0)
Platelets: 307 10*3/uL (ref 150–400)
RBC: 3.84 MIL/uL — ABNORMAL LOW (ref 3.87–5.11)
RDW: 13 % (ref 11.5–15.5)
WBC: 8.2 10*3/uL (ref 4.0–10.5)

## 2014-10-22 LAB — COMPREHENSIVE METABOLIC PANEL
ALT: 23 U/L (ref 14–54)
ANION GAP: 4 — AB (ref 5–15)
AST: 24 U/L (ref 15–41)
Albumin: 3.5 g/dL (ref 3.5–5.0)
Alkaline Phosphatase: 87 U/L (ref 38–126)
BILIRUBIN TOTAL: 0.5 mg/dL (ref 0.3–1.2)
BUN: 10 mg/dL (ref 6–20)
CALCIUM: 8.8 mg/dL — AB (ref 8.9–10.3)
CO2: 21 mmol/L — ABNORMAL LOW (ref 22–32)
Chloride: 106 mmol/L (ref 101–111)
Creatinine, Ser: 0.58 mg/dL (ref 0.44–1.00)
GFR calc Af Amer: >60 mL/min (ref >60)
Glucose, Bld: 106 mg/dL — ABNORMAL HIGH (ref 65–99)
POTASSIUM: 3.4 mmol/L — AB (ref 3.5–5.1)
Sodium: 131 mmol/L — ABNORMAL LOW (ref 135–145)
TOTAL PROTEIN: 7.9 g/dL (ref 6.5–8.1)

## 2014-10-22 LAB — WET PREP, GENITAL
Trich, Wet Prep: NONE SEEN
WBC WET PREP: NONE SEEN
YEAST WET PREP: NONE SEEN

## 2014-10-22 LAB — ABO/RH: ABO/RH(D): B POS

## 2014-10-22 NOTE — Discharge Instructions (Signed)
Safe Medications in Pregnancy   Acne: Benzoyl Peroxide Salicylic Acid  Backache/Headache: Tylenol: 2 regular strength every 4 hours OR              2 Extra strength every 6 hours  Colds/Coughs/Allergies: Benadryl (alcohol free) 25 mg every 6 hours as needed Breath right strips Claritin Cepacol throat lozenges Chloraseptic throat spray Cold-Eeze- up to three times per day Cough drops, alcohol free Flonase (by prescription only) Guaifenesin Mucinex Robitussin DM (plain only, alcohol free) Saline nasal spray/drops Sudafed (pseudoephedrine) & Actifed ** use only after [redacted] weeks gestation and if you do not have high blood pressure Tylenol Vicks Vaporub Zinc lozenges Zyrtec   Constipation: Colace Ducolax suppositories Fleet enema Glycerin suppositories Metamucil Milk of magnesia Miralax Senokot Smooth move tea  Diarrhea: Kaopectate Imodium A-D  *NO pepto Bismol  Hemorrhoids: Anusol Anusol HC Preparation H Tucks  Indigestion: Tums Maalox Mylanta Zantac  Pepcid  Insomnia: Benadryl (alcohol free) 25mg  every 6 hours as needed Tylenol PM Unisom, no Gelcaps  Leg Cramps: Tums MagGel  Nausea/Vomiting:  Bonine Dramamine Emetrol Ginger extract Sea bands Meclizine  Nausea medication to take during pregnancy:  Unisom (doxylamine succinate 25 mg tablets) Take one tablet daily at bedtime. If symptoms are not adequately controlled, the dose can be increased to a maximum recommended dose of two tablets daily (1/2 tablet in the morning, 1/2 tablet mid-afternoon and one at bedtime). Vitamin B6 100mg  tablets. Take one tablet twice a day (up to 200 mg per day).  Skin Rashes: Aveeno products Benadryl cream or 25mg  every 6 hours as needed Calamine Lotion 1% cortisone cream  Yeast infection: Gyne-lotrimin 7 Monistat 7   **If taking multiple medications, please check labels to avoid duplicating the same active ingredients **take medication as directed on  the label ** Do not exceed 4000 mg of tylenol in 24 hours **Do not take medications that contain aspirin or ibuprofen        Preterm Labor Information Preterm labor is when labor starts at less than 37 weeks of pregnancy. The normal length of a pregnancy is 39 to 41 weeks. CAUSES Often, there is no identifiable underlying cause as to why a woman goes into preterm labor. One of the most common known causes of preterm labor is infection. Infections of the uterus, cervix, vagina, amniotic sac, bladder, kidney, or even the lungs (pneumonia) can cause labor to start. Other suspected causes of preterm labor include:   Urogenital infections, such as yeast infections and bacterial vaginosis.   Uterine abnormalities (uterine shape, uterine septum, fibroids, or bleeding from the placenta).   A cervix that has been operated on (it may fail to stay closed).   Malformations in the fetus.   Multiple gestations (twins, triplets, and so on).   Breakage of the amniotic sac.  RISK FACTORS  Having a previous history of preterm labor.   Having premature rupture of membranes (PROM).   Having a placenta that covers the opening of the cervix (placenta previa).   Having a placenta that separates from the uterus (placental abruption).   Having a cervix that is too weak to hold the fetus in the uterus (incompetent cervix).   Having too much fluid in the amniotic sac (polyhydramnios).   Taking illegal drugs or smoking while pregnant.   Not gaining enough weight while pregnant.   Being younger than 55 and older than 39 years old.   Having a low socioeconomic status.   Being African American. SYMPTOMS Signs and symptoms of  preterm labor include:   Menstrual-like cramps, abdominal pain, or back pain.  Uterine contractions that are regular, as frequent as six in an hour, regardless of their intensity (may be mild or painful).  Contractions that start on the top of the uterus  and spread down to the lower abdomen and back.   A sense of increased pelvic pressure.   A watery or bloody mucus discharge that comes from the vagina.  TREATMENT Depending on the length of the pregnancy and other circumstances, your health care provider may suggest bed rest. If necessary, there are medicines that can be given to stop contractions and to mature the fetal lungs. If labor happens before 34 weeks of pregnancy, a prolonged hospital stay may be recommended. Treatment depends on the condition of both you and the fetus.  WHAT SHOULD YOU DO IF YOU THINK YOU ARE IN PRETERM LABOR? Call your health care provider right away. You will need to go to the hospital to get checked immediately. HOW CAN YOU PREVENT PRETERM LABOR IN FUTURE PREGNANCIES? You should:   Stop smoking if you smoke.  Maintain healthy weight gain and avoid chemicals and drugs that are not necessary.  Be watchful for any type of infection.  Inform your health care provider if you have a known history of preterm labor.   This information is not intended to replace advice given to you by your health care provider. Make sure you discuss any questions you have with your health care provider.   Document Released: 03/17/2003 Document Revised: 08/27/2012 Document Reviewed: 01/28/2012 Elsevier Interactive Patient Education [employer].

## 2014-10-22 NOTE — MAU Note (Signed)
Pt went to women's clinic in [facility] yesterday and had an [place] that shows she is about [redacted]weeks pregnant.  Today she presents with abdominal cramping and "I don't feel good."  No vaginal bleeding.  Reports + fetal movement at times, but not today.  Here wanting to make sure baby is okay.  States she thinks she got pregnant right after getting depo shot and feels very anxious.

## 2014-10-22 NOTE — MAU Provider Note (Signed)
History     CSN: [id number]  Arrival date and time: 10/22/14 1422   First Provider Initiated Contact with Patient 10/22/14 1458         Chief Complaint  Patient presents with  . Abdominal Cramping   HPI  Rebekah Irwin is a 39 y.o. G2P0010 at [redacted]w[redacted]d who presents for vaginal spotting & abdominal cramping. Late to care d/t not knowing she was pregnant. Was seen yesterday at [facility] outpatient ultrasound ordered for dating & anatomy.  Pink spotting since yesterday.  Abdominal cramping off & on since this morning. Rates as 4/10. No treatment.  Denies dysuria. Denies vaginal discharge.  No intercourse in the last 24 hours.  Denies vomiting, diarrhea, or constipation. Some nausea.     OB History    Gravida Para Term Preterm AB TAB SAB Ectopic Multiple Living   2    1  1          Past Medical History  Diagnosis Date  . Allergy   . Hypertension     Past Surgical History  Procedure Laterality Date  . Breast surgery      Family History  Problem Relation Age of Onset  . Hypertension Mother   . Diabetes Father   . Anuerysm Maternal Aunt   . Stroke Maternal Grandmother   . Cancer Neg Hx   . Early death Neg Hx     Social History  Substance Use Topics  . Smoking status: Former Smoker    Types: Cigarettes  . Smokeless tobacco: Never Used  . Alcohol Use: 0.6 oz/week    1 Glasses of wine per week    Allergies: No Known Allergies  Prescriptions prior to admission  Medication Sig Dispense Refill Last Dose  . ALPRAZolam (XANAX) 0.25 MG tablet Take 1 tablet (0.25 mg total) by mouth 2 (two) times daily as needed for anxiety. 60 tablet 0 Taking  . amLODipine (NORVASC) 10 MG tablet TAKE 1 TABLET BY MOUTH ONCE DAILY 30 tablet 3 Taking  . hydrochlorothiazide (HYDRODIURIL) 25 MG tablet TAKE 1 TABLET BY MOUTH ONCE DAILY 30 tablet 3 Taking  . medroxyPROGESTERone (DEPO-PROVERA) 150 MG/ML injection Inject 1 mL (150 mg total) into the muscle every 3 (three) months. 1 mL  0 Taking  . olopatadine (PATANOL) 0.1 % ophthalmic solution Place 1 drop into both eyes 2 (two) times daily. 5 mL 12 Taking  . potassium chloride (K-DUR) 10 MEQ tablet Take 1 tablet (10 mEq total) by mouth daily. 30 tablet 1 Taking    Review of Systems  Constitutional: Negative.   Respiratory: Negative.   Cardiovascular: Negative.   Gastrointestinal: Positive for nausea and abdominal pain. Negative for vomiting, diarrhea and constipation.  Genitourinary: Negative for dysuria.       + vaginal spotting   Physical Exam   Blood pressure 131/80, pulse 124, temperature 98 F (36.7 C), temperature source Oral, resp. rate 16, height 5\' 2"  (1.575 m), weight 198 lb 9.6 oz (90.084 kg), last menstrual period 05/13/2014, SpO2 95 %.  Physical Exam  Nursing note and vitals reviewed. Constitutional: She is oriented to person, place, and time. She appears well-developed and well-nourished. No distress.  HENT:  Head: Normocephalic and atraumatic.  Eyes: Conjunctivae are normal. Right eye exhibits no discharge. Left eye exhibits no discharge. No scleral icterus.  Neck: Normal range of motion.  Cardiovascular: Regular rhythm and normal heart sounds.  Tachycardia present.   No murmur heard. Respiratory: Effort normal and breath sounds normal. No respiratory distress.  She has no wheezes.  GI: Soft. Bowel sounds are normal. She exhibits distension (fundal height 22cm). There is no tenderness.  Genitourinary: Vagina normal. Cervix exhibits discharge (small amount of thin white discharge). Cervix exhibits no motion tenderness and no friability.  Neurological: She is alert and oriented to person, place, and time.  Skin: Skin is warm and dry. She is not diaphoretic.  Psychiatric: She has a normal mood and affect. Her behavior is normal. Judgment and thought content normal.   Dilation: Closed Effacement (%): Thick Cervical Position: Posterior Exam by:: Jorje Guild, NP  Fetal Tracing:  Baseline:  140 Variability: minimal to moderate Accelerations: none Decelerations: intermittent tracing d/t gestation  Toco: none   MAU Course  Procedures Results for orders placed or performed during the hospital encounter of 10/22/14 (from the past 24 hour(s))  Wet prep, genital     Status: Abnormal   Collection Time: 10/22/14  3:20 PM  Result Value Ref Range   Yeast Wet Prep HPF POC NONE SEEN NONE SEEN   Trich, Wet Prep NONE SEEN NONE SEEN   Clue Cells Wet Prep HPF POC FEW (A) NONE SEEN   WBC, Wet Prep HPF POC NONE SEEN NONE SEEN  Urinalysis, Routine w reflex microscopic (not at [facility])     Status: Abnormal   Collection Time: 10/22/14  3:25 PM  Result Value Ref Range   Color, Urine YELLOW YELLOW   APPearance CLEAR CLEAR   Specific Gravity, Urine 1.015 1.005 - 1.030   pH 7.0 5.0 - 8.0   Glucose, UA NEGATIVE NEGATIVE mg/dL   Hgb urine dipstick NEGATIVE NEGATIVE   Bilirubin Urine NEGATIVE NEGATIVE   Ketones, ur 15 (A) NEGATIVE mg/dL   Protein, ur NEGATIVE NEGATIVE mg/dL   Urobilinogen, UA 1.0 0.0 - 1.0 mg/dL   Nitrite NEGATIVE NEGATIVE   Leukocytes, UA NEGATIVE NEGATIVE  CBC     Status: Abnormal   Collection Time: 10/22/14  3:30 PM  Result Value Ref Range   WBC 8.2 4.0 - 10.5 K/uL   RBC 3.84 (L) 3.87 - 5.11 MIL/uL   Hemoglobin 11.4 (L) 12.0 - 15.0 g/dL   HCT 32.4 (L) 36.0 - 46.0 %   MCV 84.4 78.0 - 100.0 fL   MCH 29.7 26.0 - 34.0 pg   MCHC 35.2 30.0 - 36.0 g/dL   RDW 13.0 11.5 - 15.5 %   Platelets 307 150 - 400 K/uL  Comprehensive metabolic panel     Status: Abnormal   Collection Time: 10/22/14  3:30 PM  Result Value Ref Range   Sodium 131 (L) 135 - 145 mmol/L   Potassium 3.4 (L) 3.5 - 5.1 mmol/L   Chloride 106 101 - 111 mmol/L   CO2 21 (L) 22 - 32 mmol/L   Glucose, Bld 106 (H) 65 - 99 mg/dL   BUN 10 6 - 20 mg/dL   Creatinine, Ser 0.58 0.44 - 1.00 mg/dL   Calcium 8.8 (L) 8.9 - 10.3 mg/dL   Total Protein 7.9 6.5 - 8.1 g/dL   Albumin 3.5 3.5 - 5.0 g/dL   AST 24 15 - 41 U/L    ALT 23 14 - 54 U/L   Alkaline Phosphatase 87 38 - 126 U/L   Total Bilirubin 0.5 0.3 - 1.2 mg/dL   GFR calc non Af Amer >60 >60 mL/min   GFR calc Af Amer >60 >60 mL/min   Anion gap 4 (L) 5 - 15  ABO/Rh     Status: None (Preliminary result)  Collection Time: 10/22/14  3:30 PM  Result Value Ref Range   ABO/RH(D) B POS     MDM Ultrasound - no previa. Cervical length 4.3 cm Tachycardia - HR in the 120s for last 2 visits; pt asymptomatic No contractions palpated or on monitor & cervix is closed. No blood noted on exam. A positive Assessment and Plan  A: 1. Abdominal cramping affecting pregnancy   2. Vaginal bleeding in pregnancy, second trimester    P: Discharge home Keep f/u at [facility] Return next week for schedule ultrasound Preterm labor precautions  Jorje Guild, NP  10/22/2014, 2:56 PM

## 2014-10-23 LAB — HIV ANTIBODY (ROUTINE TESTING W REFLEX): HIV Screen 4th Generation wRfx: NONREACTIVE

## 2014-10-25 ENCOUNTER — Encounter: Payer: Self-pay | Admitting: Internal Medicine

## 2014-10-25 LAB — GC/CHLAMYDIA PROBE AMP (~~LOC~~) NOT AT ARMC
Chlamydia: NEGATIVE
NEISSERIA GONORRHEA: NEGATIVE

## 2014-10-26 ENCOUNTER — Other Ambulatory Visit: Payer: Self-pay | Admitting: Internal Medicine

## 2014-10-27 ENCOUNTER — Telehealth: Payer: Self-pay | Admitting: *Deleted

## 2014-10-27 NOTE — Telephone Encounter (Signed)
-----   Message from Francia Greaves sent at 10/26/2014  1:17 PM EDT ----- Contact: [phone] Wants to know about fetal movement, currently 22wk, not feeling any movement, wants to know if this is normal

## 2014-10-27 NOTE — Telephone Encounter (Signed)
Spoke with patient and told patient if she isn't feeling movement then she should go to MAU to be evaluated.  Patient is unsure if she is feeling movement or not.  Patient is still very confused and nervous about the pregnancy since she found out she was pregnant late in the pregnancy.  Patient has an ultrasound scheduled for this Friday 10/21.

## 2014-10-29 ENCOUNTER — Other Ambulatory Visit: Payer: Self-pay | Admitting: Obstetrics and Gynecology

## 2014-10-29 ENCOUNTER — Ambulatory Visit (HOSPITAL_COMMUNITY)
Admission: RE | Admit: 2014-10-29 | Discharge: 2014-10-29 | Disposition: A | Discharge status: Home / Self Care | Payer: 59 | Source: Ambulatory Visit | Attending: Obstetrics and Gynecology | Admitting: Obstetrics and Gynecology

## 2014-10-29 ENCOUNTER — Encounter (HOSPITAL_COMMUNITY): Payer: Self-pay

## 2014-10-29 DIAGNOSIS — O09522 Supervision of elderly multigravida, second trimester: Secondary | ICD-10-CM | POA: Insufficient documentation

## 2014-10-29 DIAGNOSIS — Z315 Encounter for genetic counseling: Secondary | ICD-10-CM | POA: Insufficient documentation

## 2014-10-29 DIAGNOSIS — Z3490 Encounter for supervision of normal pregnancy, unspecified, unspecified trimester: Secondary | ICD-10-CM

## 2014-10-29 DIAGNOSIS — Z3A24 24 weeks gestation of pregnancy: Secondary | ICD-10-CM | POA: Insufficient documentation

## 2014-10-29 DIAGNOSIS — Z363 Encounter for antenatal screening for malformations: Secondary | ICD-10-CM

## 2014-10-29 DIAGNOSIS — Z36 Encounter for antenatal screening of mother: Secondary | ICD-10-CM | POA: Insufficient documentation

## 2014-10-29 DIAGNOSIS — O09529 Supervision of elderly multigravida, unspecified trimester: Secondary | ICD-10-CM | POA: Insufficient documentation

## 2014-10-29 NOTE — Progress Notes (Signed)
Genetic Counseling  High-Risk Gestation Note  Appointment Date:  10/29/2014 Referred By: Rebekah Fenton, NP Date of Birth:  1975/10/20   Pregnancy History: G2P0010 Estimated Date of Delivery: 02/17/15 Estimated Gestational Age: [redacted]w[redacted]d Attending: Renella Cunas, MD  Ms. Rebekah Irwin was seen for genetic counseling because of a maternal age of 39 y.o..   [employer] Genetic Counseling Intern, Silva Bandy, assisted with genetic counseling under my direct supervision.   In Summary:  Discussed maternal age related ~1 in 56 risk for fetal aneuploidy  Detailed ultrasound performed today and within normal limits  Patient declined amniocentesis  Patient would like to pursue NIPS (Panorama) and would like for it to be drawn at her next OB visit. She declined for this to be performed at today's visit.   She was counseled regarding maternal age and the association with risk for chromosome conditions due to nondisjunction with aging of the ova.  We reviewed chromosomes, nondisjunction, and the associated 1 in 25 risk for fetal aneuploidy related to a maternal age of 39 y.o. at [redacted]w[redacted]d gestation.  She was counseled that the risk for aneuploidy decreases as gestational age increases, accounting for those pregnancies which spontaneously abort.  We specifically discussed Down syndrome (trisomy 56), trisomies 59 and 71, and sex chromosome aneuploidies (47,XXX and 47,XXY) including the common features and prognoses of each.   We reviewed available screening options including noninvasive prenatal screening (NIPS)/prenatal cell free DNA (cfDNA) testing and detailed ultrasound.  She was counseled that screening tests are used to modify a patient's a priori risk for aneuploidy, typically based on age. This estimate provides a pregnancy specific risk assessment. We reviewed the benefits and limitations of each option. Specifically, we discussed the conditions for which each test screens, the detection rates, and false  positive rates of each. She was also counseled regarding diagnostic testing via amniocentesis. We reviewed the approximate 1 in [phone] risk for complications for amniocentesis, including spontaneous preterm labor and delivery.  Detailed ultrasound was performed today. Visualized fetal anatomy was within normal limits. After consideration of all the options, she expressed interest in NIPS (Panorama) and stated that she plans for this to be drawn at her next OB visit. She declined pursuing NIPS at today's appointment. The patient declined amniocentesis.  She understands that screening tests cannot rule out all birth defects or genetic syndromes. The patient was advised of this limitation and states she still does not want additional testing at this time.   Ms. Rebekah Irwin was provided with written information regarding sickle cell anemia (SCA) including the carrier frequency and incidence in the African-American population, the availability of carrier testing and prenatal diagnosis if indicated.  In addition, we discussed that hemoglobinopathies are routinely screened for as part of the [place] newborn screening panel.  She declined hemoglobin electrophoresis today.  Both family histories were reviewed and found to be contributory for a female maternal first cousin once removed with hydrocephalus. The underlying cause was not known today. Hydrocephalus can be isolated (nonsyndromic) or seen as one feature of an underlying chromosome or genetic condition. Hydrocephalus is typically isolated and multifactorial, involving a combination of genetic and environmental contributing factors.  Rarely, nonsyndromic hydrocephalus can follow autosomal recessive or autosomal dominant inheritance. X-linked hydrocephalus is also observed in some families. We discussed that when isolated and when multifactorial inheritance is suspected, recurrence risk for full siblings is approximately 1-2%. Thus, given the reported family  history and the degree of relation (a fifth degree relative to  the current pregnancy), recurrence risk for the current pregnancy is not expected to be increased above the general population risk, in the case of multifactorial inheritance. Additional information regarding an underlying cause for hydrocephalus in the family may alter recurrence risk assessment. Targeted ultrasound is available to assess for features of hydrocephalus prenatally. However, the patient understands that ultrasound cannot diagnose or rule out all birth defects prenatally.    Additionally, [name] reported a different female maternal first cousin once removed born missing one arm. He also reportedly has vision loss and intellectual disability. Limited information was known regarding a specific cause for his features. Given this report, a single underlying etiology is more suspected than multifactorial causes. Given the reported family history and the degree of relation, recurrence risk for the current pregnancy would likely be low. However, additional information is needed to assess recurrence risk for relatives. Without further information regarding the provided family history, an accurate genetic risk cannot be calculated. Further genetic counseling is warranted if more information is obtained.  Ms. Rebekah Irwin denied exposure to environmental toxins or chemical agents. She denied the use of tobacco or street drugs. She reported drinking alcohol approximately 2-3 times per week (approximately a glass of wine at dinner on those occasions) until approximately 22 weeks in the pregnancy, given that she was not aware of the pregnancy prior to that time.  Prenatal alcohol exposure can increase the risk for growth delays, small head size, heart defects, eye and facial differences, as well as behavior problems and learning disabilities. The risk of these to occur tends to increase with the amount of alcohol consumed. However,  because there is no identified safe amount of alcohol in pregnancy, it is recommended to completely avoid alcohol in pregnancy. We discussed that targeted ultrasound and fetal echocardiogram are available to assess fetal anatomy. Detailed ultrasound was performed today. The patient declined fetal echocardiogram. She denied significant viral illnesses during the course of her pregnancy. Her medical and surgical histories were noncontributory.   I counseled Ms. JAYLIANI WANNER regarding the above risks and available options.  The approximate face-to-face time with the genetic counselor was 45 minutes.  Chipper Oman, MS,  Certified Genetic Counselor 10/29/2014

## 2014-11-03 ENCOUNTER — Telehealth: Payer: Self-pay | Admitting: Obstetrics and Gynecology

## 2014-11-03 NOTE — Telephone Encounter (Signed)
No answer, message left on VM 

## 2014-11-05 LAB — OB RESULTS CONSOLE ANTIBODY SCREEN: ANTIBODY SCREEN: NEGATIVE

## 2014-11-05 LAB — OB RESULTS CONSOLE HEPATITIS B SURFACE ANTIGEN: Hepatitis B Surface Ag: NEGATIVE

## 2014-11-05 LAB — OB RESULTS CONSOLE GC/CHLAMYDIA
Chlamydia: NEGATIVE
GC PROBE AMP, GENITAL: NEGATIVE

## 2014-11-05 LAB — OB RESULTS CONSOLE RPR: RPR: NONREACTIVE

## 2014-11-05 LAB — OB RESULTS CONSOLE ABO/RH: RH TYPE: POSITIVE

## 2014-11-05 LAB — OB RESULTS CONSOLE HIV ANTIBODY (ROUTINE TESTING): HIV: NONREACTIVE

## 2014-11-05 LAB — OB RESULTS CONSOLE RUBELLA ANTIBODY, IGM: Rubella: IMMUNE

## 2014-12-05 ENCOUNTER — Inpatient Hospital Stay (HOSPITAL_COMMUNITY)
Admission: AD | Admit: 2014-12-05 | Discharge: 2014-12-05 | Disposition: A | Discharge status: Home / Self Care | Payer: 59 | Source: Ambulatory Visit | Attending: Obstetrics & Gynecology | Admitting: Obstetrics & Gynecology

## 2014-12-05 ENCOUNTER — Encounter (HOSPITAL_COMMUNITY): Payer: Self-pay | Admitting: *Deleted

## 2014-12-05 DIAGNOSIS — Z87891 Personal history of nicotine dependence: Secondary | ICD-10-CM | POA: Diagnosis not present

## 2014-12-05 DIAGNOSIS — O09529 Supervision of elderly multigravida, unspecified trimester: Secondary | ICD-10-CM

## 2014-12-05 DIAGNOSIS — K219 Gastro-esophageal reflux disease without esophagitis: Secondary | ICD-10-CM | POA: Insufficient documentation

## 2014-12-05 DIAGNOSIS — Z3A29 29 weeks gestation of pregnancy: Secondary | ICD-10-CM | POA: Diagnosis not present

## 2014-12-05 DIAGNOSIS — O10913 Unspecified pre-existing hypertension complicating pregnancy, third trimester: Secondary | ICD-10-CM | POA: Insufficient documentation

## 2014-12-05 DIAGNOSIS — Z823 Family history of stroke: Secondary | ICD-10-CM | POA: Diagnosis not present

## 2014-12-05 DIAGNOSIS — Z79899 Other long term (current) drug therapy: Secondary | ICD-10-CM | POA: Insufficient documentation

## 2014-12-05 DIAGNOSIS — Z8249 Family history of ischemic heart disease and other diseases of the circulatory system: Secondary | ICD-10-CM | POA: Insufficient documentation

## 2014-12-05 DIAGNOSIS — K5900 Constipation, unspecified: Secondary | ICD-10-CM | POA: Insufficient documentation

## 2014-12-05 DIAGNOSIS — O368130 Decreased fetal movements, third trimester, not applicable or unspecified: Secondary | ICD-10-CM | POA: Insufficient documentation

## 2014-12-05 DIAGNOSIS — O26893 Other specified pregnancy related conditions, third trimester: Secondary | ICD-10-CM | POA: Insufficient documentation

## 2014-12-05 DIAGNOSIS — Z833 Family history of diabetes mellitus: Secondary | ICD-10-CM | POA: Diagnosis not present

## 2014-12-05 LAB — URINALYSIS, ROUTINE W REFLEX MICROSCOPIC
BILIRUBIN URINE: NEGATIVE
Glucose, UA: NEGATIVE mg/dL
Hgb urine dipstick: NEGATIVE
Ketones, ur: NEGATIVE mg/dL
LEUKOCYTES UA: NEGATIVE
NITRITE: NEGATIVE
PH: 6.5 (ref 5.0–8.0)
Protein, ur: NEGATIVE mg/dL

## 2014-12-05 MED ORDER — MAGNESIUM OXIDE 400 (241.3 MG) MG PO TABS
400.0000 mg | ORAL_TABLET | Freq: Every day | ORAL | Status: DC
Start: 2014-12-05 — End: 2014-12-05
  Administered 2014-12-05: 400 mg via ORAL
  Filled 2014-12-05: qty 1

## 2014-12-05 MED ORDER — POLYETHYLENE GLYCOL 3350 17 G PO PACK
17.0000 g | PACK | Freq: Every day | ORAL | Status: DC
  Administered 2014-12-05: 17 g via ORAL
  Filled 2014-12-05: qty 1

## 2014-12-05 MED ORDER — MAGNESIUM OXIDE 400 (241.3 MG) MG PO TABS: 400.0000 mg | ORAL_TABLET | Freq: Every day | ORAL | Status: DC

## 2014-12-05 MED ORDER — DOCUSATE SODIUM 100 MG PO CAPS
200.0000 mg | ORAL_CAPSULE | ORAL | Status: AC
  Administered 2014-12-05: 200 mg via ORAL
  Filled 2014-12-05: qty 2

## 2014-12-05 MED ORDER — RANITIDINE HCL 150 MG PO TABS: 150.0000 mg | ORAL_TABLET | Freq: Every day | ORAL | Status: DC

## 2014-12-05 MED ORDER — FAMOTIDINE 20 MG PO TABS
20.0000 mg | ORAL_TABLET | Freq: Once | ORAL | Status: AC
  Administered 2014-12-05: 20 mg via ORAL
  Filled 2014-12-05: qty 1

## 2014-12-05 NOTE — MAU Provider Note (Signed)
  History     CSN: [id number]  Arrival date and time: 12/05/14 [id number]    Chief Complaint  Patient presents with  . Decreased Fetal Movement   HPI Decreased FM all night - not a lot of movement yesterday Decreased appetite - daily nausea and heartburn Constipation - no BM in 4 days  Past Medical History  Diagnosis Date  . Allergy   . Hypertension     Past Surgical History  Procedure Laterality Date  . Breast surgery      Family History  Problem Relation Age of Onset  . Hypertension Mother   . Diabetes Father   . Anuerysm Maternal Aunt   . Stroke Maternal Grandmother   . Cancer Neg Hx   . Early death Neg Hx     Social History  Substance Use Topics  . Smoking status: Former Smoker    Types: Cigarettes  . Smokeless tobacco: Never Used  . Alcohol Use: 0.6 oz/week    1 Glasses of wine per week    Allergies: No Known Allergies  Prescriptions prior to admission  Medication Sig Dispense Refill Last Dose  . DiphenhydrAMINE HCl (BENADRYL PO) Take 1 tablet by mouth at bedtime as needed (sleep).   Past Week at Unknown time  . Prenatal Vit-Fe Fumarate-FA (PRENATAL VITAMIN PO) Take 1 tablet by mouth daily.    12/04/2014 at Unknown time  . amLODipine (NORVASC) 10 MG tablet TAKE 1 TABLET BY MOUTH ONCE DAILY (Patient not taking: Reported on 12/05/2014) 30 tablet 3 Not Taking at Unknown time  . hydrochlorothiazide (HYDRODIURIL) 25 MG tablet TAKE 1 TABLET BY MOUTH ONCE DAILY (Patient not taking: Reported on 12/05/2014) 30 tablet 3 Not Taking at Unknown time    ROS Physical Exam   Blood pressure 113/78, pulse 122, temperature 98.4 F (36.9 C), resp. rate 20, height 5\' 2"  (1.575 m), weight 97.433 kg (214 lb 12.8 oz), last menstrual period 05/13/2014, SpO2 99 %.  Physical Exam Alert and oriented Active bowel sounds Abdomen soft and non-distended / non-tender  MAU Course  Procedures NST - reassuring for gestational age - no previous NST for comparison Baseline 135 to 138  with occasional accel to 150  Assessment and Plan  29.3 weeks Decreased FM Constipation and GERD  1) reassuring fetal monitoring - patient aware of FM while monitoring 2) start Zantac for GERD to reduce daily nausea - eat small frequent meals and do not skip meals 3) daily magnesium for constipation prevention - more water to drink - whole food fiber        miralax if no BM in 3 days - take daily until normal BM        Colace if hard BM and increase more water in diet 4) [facility] - call if absent or decreased FM  Rebekah Irwin 12/05/2014, 8:31 AM

## 2014-12-05 NOTE — MAU Note (Signed)
Pt has been nauseated since yesterday; c/o decreased fetal movement since yesterday; c/o constipation for past 4 days; hx of chronic hypertension; upset and anxious;

## 2014-12-05 NOTE — MAU Note (Signed)
FM decreased since yesterday. Has felt some movement but less than usual. Denies leaking fld or bleeding. Some clear vag d/c. Hx chronic HTN

## 2015-01-14 DIAGNOSIS — O283 Abnormal ultrasonic finding on antenatal screening of mother: Secondary | ICD-10-CM | POA: Diagnosis not present

## 2015-01-14 DIAGNOSIS — O10013 Pre-existing essential hypertension complicating pregnancy, third trimester: Secondary | ICD-10-CM | POA: Diagnosis not present

## 2015-01-14 DIAGNOSIS — O133 Gestational [pregnancy-induced] hypertension without significant proteinuria, third trimester: Secondary | ICD-10-CM | POA: Diagnosis not present

## 2015-01-14 DIAGNOSIS — O09523 Supervision of elderly multigravida, third trimester: Secondary | ICD-10-CM | POA: Diagnosis not present

## 2015-01-14 DIAGNOSIS — Z3A35 35 weeks gestation of pregnancy: Secondary | ICD-10-CM | POA: Diagnosis not present

## 2015-01-14 LAB — OB RESULTS CONSOLE GBS: STREP GROUP B AG: NEGATIVE

## 2015-01-19 DIAGNOSIS — O1213 Gestational proteinuria, third trimester: Secondary | ICD-10-CM | POA: Diagnosis not present

## 2015-01-19 DIAGNOSIS — Z3A35 35 weeks gestation of pregnancy: Secondary | ICD-10-CM | POA: Diagnosis not present

## 2015-01-19 DIAGNOSIS — O1210 Gestational proteinuria, unspecified trimester: Secondary | ICD-10-CM | POA: Diagnosis not present

## 2015-01-19 LAB — HEPATIC FUNCTION PANEL
ALT: 10 U/L (ref 7–35)
AST: 13 (ref 13–35)
Alkaline Phosphatase: 117 (ref 25–125)

## 2015-01-19 LAB — RESULTS CONSOLE HPV: CHL HPV: POSITIVE

## 2015-01-19 LAB — BASIC METABOLIC PANEL
BUN: 8 (ref 4–21)
CO2: 20 (ref 13–22)
Chloride: 104 (ref 99–108)
Creatinine: 0.5 (ref 0.5–1.1)
Glucose: 133
Potassium: 4.2 mEq/L (ref 3.5–5.1)
Sodium: 138 (ref 137–147)

## 2015-01-19 LAB — COMPREHENSIVE METABOLIC PANEL
Albumin: 3.3 — AB (ref 3.5–5.0)
Calcium: 9 (ref 8.7–10.7)

## 2015-01-19 LAB — HM PAP SMEAR: HM Pap smear: NEGATIVE

## 2015-01-19 LAB — URIC ACID: Uric Acid: 4.4

## 2015-01-27 DIAGNOSIS — Z3A37 37 weeks gestation of pregnancy: Secondary | ICD-10-CM | POA: Diagnosis not present

## 2015-01-27 DIAGNOSIS — O283 Abnormal ultrasonic finding on antenatal screening of mother: Secondary | ICD-10-CM | POA: Diagnosis not present

## 2015-01-27 DIAGNOSIS — O368130 Decreased fetal movements, third trimester, not applicable or unspecified: Secondary | ICD-10-CM | POA: Diagnosis not present

## 2015-02-11 DIAGNOSIS — G5602 Carpal tunnel syndrome, left upper limb: Secondary | ICD-10-CM | POA: Diagnosis not present

## 2015-02-11 DIAGNOSIS — Z3A39 39 weeks gestation of pregnancy: Secondary | ICD-10-CM | POA: Diagnosis not present

## 2015-02-11 DIAGNOSIS — O283 Abnormal ultrasonic finding on antenatal screening of mother: Secondary | ICD-10-CM | POA: Diagnosis not present

## 2015-02-14 DIAGNOSIS — Z3A39 39 weeks gestation of pregnancy: Secondary | ICD-10-CM | POA: Diagnosis not present

## 2015-02-14 DIAGNOSIS — O283 Abnormal ultrasonic finding on antenatal screening of mother: Secondary | ICD-10-CM | POA: Diagnosis not present

## 2015-02-18 DIAGNOSIS — O283 Abnormal ultrasonic finding on antenatal screening of mother: Secondary | ICD-10-CM | POA: Diagnosis not present

## 2015-02-18 DIAGNOSIS — Z3A40 40 weeks gestation of pregnancy: Secondary | ICD-10-CM | POA: Diagnosis not present

## 2015-02-23 ENCOUNTER — Inpatient Hospital Stay (HOSPITAL_COMMUNITY): Payer: 59 | Admitting: Anesthesiology

## 2015-02-23 ENCOUNTER — Encounter (HOSPITAL_COMMUNITY)
Admission: AD | Disposition: A | Discharge status: Home / Self Care | Payer: Self-pay | Source: Ambulatory Visit | Attending: Obstetrics and Gynecology

## 2015-02-23 ENCOUNTER — Encounter (HOSPITAL_COMMUNITY): Payer: Self-pay

## 2015-02-23 ENCOUNTER — Inpatient Hospital Stay (HOSPITAL_COMMUNITY)
Admission: AD | Admit: 2015-02-23 | Discharge: 2015-02-25 | DRG: 765 | Disposition: A | Discharge status: Home / Self Care | Payer: 59 | Source: Ambulatory Visit | Attending: Obstetrics and Gynecology | Admitting: Obstetrics and Gynecology

## 2015-02-23 DIAGNOSIS — Z823 Family history of stroke: Secondary | ICD-10-CM

## 2015-02-23 DIAGNOSIS — O3413 Maternal care for benign tumor of corpus uteri, third trimester: Secondary | ICD-10-CM | POA: Diagnosis present

## 2015-02-23 DIAGNOSIS — O09529 Supervision of elderly multigravida, unspecified trimester: Secondary | ICD-10-CM

## 2015-02-23 DIAGNOSIS — Z87891 Personal history of nicotine dependence: Secondary | ICD-10-CM

## 2015-02-23 DIAGNOSIS — D259 Leiomyoma of uterus, unspecified: Secondary | ICD-10-CM | POA: Diagnosis present

## 2015-02-23 DIAGNOSIS — O99214 Obesity complicating childbirth: Secondary | ICD-10-CM | POA: Diagnosis present

## 2015-02-23 DIAGNOSIS — Z6841 Body Mass Index (BMI) 40.0 and over, adult: Secondary | ICD-10-CM | POA: Diagnosis not present

## 2015-02-23 DIAGNOSIS — O1092 Unspecified pre-existing hypertension complicating childbirth: Secondary | ICD-10-CM | POA: Diagnosis present

## 2015-02-23 DIAGNOSIS — Z8249 Family history of ischemic heart disease and other diseases of the circulatory system: Secondary | ICD-10-CM | POA: Diagnosis not present

## 2015-02-23 DIAGNOSIS — O480 Post-term pregnancy: Principal | ICD-10-CM | POA: Diagnosis present

## 2015-02-23 DIAGNOSIS — Z833 Family history of diabetes mellitus: Secondary | ICD-10-CM

## 2015-02-23 DIAGNOSIS — Z3A40 40 weeks gestation of pregnancy: Secondary | ICD-10-CM | POA: Diagnosis not present

## 2015-02-23 DIAGNOSIS — Z3490 Encounter for supervision of normal pregnancy, unspecified, unspecified trimester: Secondary | ICD-10-CM

## 2015-02-23 HISTORY — DX: Anxiety disorder, unspecified: F41.9

## 2015-02-23 LAB — TYPE AND SCREEN
ABO/RH(D): B POS
ANTIBODY SCREEN: NEGATIVE

## 2015-02-23 LAB — CBC
HEMATOCRIT: 32.6 % — AB (ref 36.0–46.0)
HEMOGLOBIN: 11.2 g/dL — AB (ref 12.0–15.0)
MCH: 27.9 pg (ref 26.0–34.0)
MCHC: 34.4 g/dL (ref 30.0–36.0)
MCV: 81.3 fL (ref 78.0–100.0)
Platelets: 337 10*3/uL (ref 150–400)
RBC: 4.01 MIL/uL (ref 3.87–5.11)
RDW: 13.9 % (ref 11.5–15.5)
WBC: 8.9 10*3/uL (ref 4.0–10.5)

## 2015-02-23 SURGERY — Surgical Case
Anesthesia: Spinal

## 2015-02-23 MED ORDER — CEFAZOLIN SODIUM-DEXTROSE 2-3 GM-% IV SOLR
INTRAVENOUS | Status: DC | PRN
  Administered 2015-02-23: 2 g via INTRAVENOUS

## 2015-02-23 MED ORDER — KETOROLAC TROMETHAMINE 30 MG/ML IJ SOLN
30.0000 mg | Freq: Four times a day (QID) | INTRAMUSCULAR | Status: AC | PRN
  Administered 2015-02-23: 30 mg via INTRAVENOUS
  Filled 2015-02-23: qty 1

## 2015-02-23 MED ORDER — ONDANSETRON HCL 4 MG/2ML IJ SOLN
4.0000 mg | Freq: Four times a day (QID) | INTRAMUSCULAR | Status: DC | PRN
  Administered 2015-02-23: 4 mg via INTRAVENOUS

## 2015-02-23 MED ORDER — SODIUM CHLORIDE 0.9 % IR SOLN
Status: DC | PRN
  Administered 2015-02-23: 1

## 2015-02-23 MED ORDER — ZOLPIDEM TARTRATE 5 MG PO TABS: 5.0000 mg | ORAL_TABLET | Freq: Every evening | ORAL | Status: DC | PRN

## 2015-02-23 MED ORDER — SODIUM CHLORIDE 0.9% FLUSH: 3.0000 mL | Freq: Two times a day (BID) | INTRAVENOUS | Status: DC

## 2015-02-23 MED ORDER — NALBUPHINE HCL 10 MG/ML IJ SOLN
5.0000 mg | INTRAMUSCULAR | Status: DC | PRN
  Administered 2015-02-23: 5 mg via INTRAVENOUS
  Filled 2015-02-23: qty 1

## 2015-02-23 MED ORDER — LANOLIN HYDROUS EX OINT: 1.0000 "application " | TOPICAL_OINTMENT | CUTANEOUS | Status: DC | PRN

## 2015-02-23 MED ORDER — SODIUM CHLORIDE 0.9 % IV SOLN: 250.0000 mL | INTRAVENOUS | Status: DC

## 2015-02-23 MED ORDER — FENTANYL CITRATE (PF) 100 MCG/2ML IJ SOLN
INTRAMUSCULAR | Status: AC
  Filled 2015-02-23: qty 2

## 2015-02-23 MED ORDER — DIPHENHYDRAMINE HCL 25 MG PO CAPS
25.0000 mg | ORAL_CAPSULE | ORAL | Status: DC | PRN
  Filled 2015-02-23: qty 1

## 2015-02-23 MED ORDER — MISOPROSTOL 25 MCG QUARTER TABLET
25.0000 ug | ORAL_TABLET | ORAL | Status: DC | PRN
  Administered 2015-02-23: 25 ug via VAGINAL
  Filled 2015-02-23: qty 0.25

## 2015-02-23 MED ORDER — PRENATAL MULTIVITAMIN CH
1.0000 | ORAL_TABLET | Freq: Every day | ORAL | Status: DC
  Administered 2015-02-24: 1 via ORAL
  Filled 2015-02-23 (×3): qty 1

## 2015-02-23 MED ORDER — WITCH HAZEL-GLYCERIN EX PADS: 1.0000 "application " | MEDICATED_PAD | CUTANEOUS | Status: DC | PRN

## 2015-02-23 MED ORDER — LIDOCAINE HCL (PF) 1 % IJ SOLN: 30.0000 mL | INTRAMUSCULAR | Status: DC | PRN

## 2015-02-23 MED ORDER — SCOPOLAMINE 1 MG/3DAYS TD PT72: 1.0000 | MEDICATED_PATCH | Freq: Once | TRANSDERMAL | Status: DC

## 2015-02-23 MED ORDER — DIPHENHYDRAMINE HCL 50 MG/ML IJ SOLN: 12.5000 mg | INTRAMUSCULAR | Status: DC | PRN

## 2015-02-23 MED ORDER — NALOXONE HCL 2 MG/2ML IJ SOSY
1.0000 ug/kg/h | PREFILLED_SYRINGE | INTRAMUSCULAR | Status: DC | PRN
  Filled 2015-02-23: qty 2

## 2015-02-23 MED ORDER — LACTATED RINGERS IV SOLN: 2.5000 [IU]/h | INTRAVENOUS | Status: DC

## 2015-02-23 MED ORDER — OXYCODONE-ACETAMINOPHEN 5-325 MG PO TABS: 1.0000 | ORAL_TABLET | ORAL | Status: DC | PRN

## 2015-02-23 MED ORDER — IBUPROFEN 600 MG PO TABS
600.0000 mg | ORAL_TABLET | Freq: Four times a day (QID) | ORAL | Status: DC
  Administered 2015-02-24 – 2015-02-25 (×5): 600 mg via ORAL
  Filled 2015-02-23 (×6): qty 1

## 2015-02-23 MED ORDER — LACTATED RINGERS IV SOLN: 500.0000 mL | INTRAVENOUS | Status: DC | PRN

## 2015-02-23 MED ORDER — OXYTOCIN 10 UNIT/ML IJ SOLN
2.5000 [IU]/h | INTRAMUSCULAR | Status: AC
Start: 2015-02-23 — End: 2015-02-23

## 2015-02-23 MED ORDER — ONDANSETRON HCL 4 MG/2ML IJ SOLN: 4.0000 mg | Freq: Once | INTRAMUSCULAR | Status: DC | PRN

## 2015-02-23 MED ORDER — OXYCODONE-ACETAMINOPHEN 5-325 MG PO TABS
1.0000 | ORAL_TABLET | ORAL | Status: DC | PRN
  Administered 2015-02-25: 1 via ORAL
  Filled 2015-02-23: qty 1

## 2015-02-23 MED ORDER — KETOROLAC TROMETHAMINE 30 MG/ML IJ SOLN
INTRAMUSCULAR | Status: AC
  Filled 2015-02-23: qty 1

## 2015-02-23 MED ORDER — OXYCODONE-ACETAMINOPHEN 5-325 MG PO TABS: 2.0000 | ORAL_TABLET | ORAL | Status: DC | PRN

## 2015-02-23 MED ORDER — ZOLPIDEM TARTRATE 5 MG PO TABS
5.0000 mg | ORAL_TABLET | Freq: Every evening | ORAL | Status: DC | PRN
  Administered 2015-02-23: 5 mg via ORAL
  Filled 2015-02-23: qty 1

## 2015-02-23 MED ORDER — KETOROLAC TROMETHAMINE 30 MG/ML IJ SOLN
30.0000 mg | Freq: Four times a day (QID) | INTRAMUSCULAR | Status: AC | PRN
  Administered 2015-02-23: 30 mg via INTRAMUSCULAR

## 2015-02-23 MED ORDER — ACETAMINOPHEN 500 MG PO TABS
1000.0000 mg | ORAL_TABLET | Freq: Four times a day (QID) | ORAL | Status: AC
  Administered 2015-02-24: 1000 mg via ORAL
  Filled 2015-02-23: qty 2

## 2015-02-23 MED ORDER — LACTATED RINGERS IV SOLN
INTRAVENOUS | Status: DC
  Administered 2015-02-23 (×3): via INTRAVENOUS

## 2015-02-23 MED ORDER — SIMETHICONE 80 MG PO CHEW
80.0000 mg | CHEWABLE_TABLET | ORAL | Status: DC | PRN
  Filled 2015-02-23: qty 1

## 2015-02-23 MED ORDER — PHENYLEPHRINE 8 MG IN D5W 100 ML (0.08MG/ML) PREMIX OPTIME
INJECTION | INTRAVENOUS | Status: DC | PRN
  Administered 2015-02-23: 30 ug/min via INTRAVENOUS

## 2015-02-23 MED ORDER — SODIUM CHLORIDE 0.9% FLUSH: 3.0000 mL | INTRAVENOUS | Status: DC | PRN

## 2015-02-23 MED ORDER — ONDANSETRON HCL 4 MG/2ML IJ SOLN: 4.0000 mg | Freq: Three times a day (TID) | INTRAMUSCULAR | Status: DC | PRN

## 2015-02-23 MED ORDER — BISACODYL 10 MG RE SUPP
10.0000 mg | Freq: Every day | RECTAL | Status: DC | PRN
  Filled 2015-02-23: qty 1

## 2015-02-23 MED ORDER — CITRIC ACID-SODIUM CITRATE 334-500 MG/5ML PO SOLN
30.0000 mL | ORAL | Status: DC | PRN
  Administered 2015-02-23: 30 mL via ORAL
  Filled 2015-02-23: qty 15

## 2015-02-23 MED ORDER — FENTANYL CITRATE (PF) 100 MCG/2ML IJ SOLN: 25.0000 ug | INTRAMUSCULAR | Status: DC | PRN

## 2015-02-23 MED ORDER — ACETAMINOPHEN 325 MG PO TABS: 650.0000 mg | ORAL_TABLET | ORAL | Status: DC | PRN

## 2015-02-23 MED ORDER — FENTANYL CITRATE (PF) 100 MCG/2ML IJ SOLN
INTRAMUSCULAR | Status: DC | PRN
  Administered 2015-02-23: 10 ug via INTRATHECAL

## 2015-02-23 MED ORDER — DEXAMETHASONE SODIUM PHOSPHATE 4 MG/ML IJ SOLN
INTRAMUSCULAR | Status: DC | PRN
  Administered 2015-02-23: 4 mg via INTRAVENOUS

## 2015-02-23 MED ORDER — NALBUPHINE HCL 10 MG/ML IJ SOLN: 5.0000 mg | INTRAMUSCULAR | Status: DC | PRN

## 2015-02-23 MED ORDER — SCOPOLAMINE 1 MG/3DAYS TD PT72
MEDICATED_PATCH | TRANSDERMAL | Status: DC | PRN
  Administered 2015-02-23: 1 via TRANSDERMAL

## 2015-02-23 MED ORDER — LACTATED RINGERS IV SOLN
INTRAVENOUS | Status: DC
  Administered 2015-02-23: 11:00:00 via INTRAVENOUS

## 2015-02-23 MED ORDER — BUPIVACAINE HCL (PF) 0.25 % IJ SOLN
INTRAMUSCULAR | Status: AC
  Filled 2015-02-23: qty 10

## 2015-02-23 MED ORDER — OXYTOCIN 10 UNIT/ML IJ SOLN
INTRAMUSCULAR | Status: AC
  Filled 2015-02-23: qty 4

## 2015-02-23 MED ORDER — CEFAZOLIN SODIUM-DEXTROSE 2-3 GM-% IV SOLR: 2.0000 g | INTRAVENOUS | Status: DC

## 2015-02-23 MED ORDER — ONDANSETRON HCL 4 MG/2ML IJ SOLN
INTRAMUSCULAR | Status: AC
  Filled 2015-02-23: qty 2

## 2015-02-23 MED ORDER — NALBUPHINE HCL 10 MG/ML IJ SOLN: 5.0000 mg | Freq: Once | INTRAMUSCULAR | Status: DC | PRN

## 2015-02-23 MED ORDER — TERBUTALINE SULFATE 1 MG/ML IJ SOLN: 0.2500 mg | Freq: Once | INTRAMUSCULAR | Status: DC | PRN

## 2015-02-23 MED ORDER — DEXAMETHASONE SODIUM PHOSPHATE 4 MG/ML IJ SOLN
INTRAMUSCULAR | Status: AC
  Filled 2015-02-23: qty 1

## 2015-02-23 MED ORDER — PHENYLEPHRINE 8 MG IN D5W 100 ML (0.08MG/ML) PREMIX OPTIME
INJECTION | INTRAVENOUS | Status: AC
  Filled 2015-02-23: qty 100

## 2015-02-23 MED ORDER — FLEET ENEMA 7-19 GM/118ML RE ENEM: 1.0000 | ENEMA | Freq: Every day | RECTAL | Status: DC | PRN

## 2015-02-23 MED ORDER — MEPERIDINE HCL 25 MG/ML IJ SOLN: 6.2500 mg | INTRAMUSCULAR | Status: DC | PRN

## 2015-02-23 MED ORDER — SIMETHICONE 80 MG PO CHEW
80.0000 mg | CHEWABLE_TABLET | Freq: Three times a day (TID) | ORAL | Status: DC
  Administered 2015-02-23 – 2015-02-24 (×4): 80 mg via ORAL
  Filled 2015-02-23 (×11): qty 1

## 2015-02-23 MED ORDER — FERROUS SULFATE 325 (65 FE) MG PO TABS
325.0000 mg | ORAL_TABLET | Freq: Two times a day (BID) | ORAL | Status: DC
  Administered 2015-02-24: 325 mg via ORAL
  Filled 2015-02-23 (×7): qty 1

## 2015-02-23 MED ORDER — MORPHINE SULFATE (PF) 0.5 MG/ML IJ SOLN
INTRAMUSCULAR | Status: AC
  Filled 2015-02-23: qty 10

## 2015-02-23 MED ORDER — OXYTOCIN BOLUS FROM INFUSION: 500.0000 mL | INTRAVENOUS | Status: DC

## 2015-02-23 MED ORDER — MENTHOL 3 MG MT LOZG
1.0000 | LOZENGE | OROMUCOSAL | Status: DC | PRN
  Filled 2015-02-23: qty 9

## 2015-02-23 MED ORDER — CEFAZOLIN SODIUM-DEXTROSE 2-3 GM-% IV SOLR
INTRAVENOUS | Status: AC
  Filled 2015-02-23: qty 50

## 2015-02-23 MED ORDER — NALOXONE HCL 0.4 MG/ML IJ SOLN: 0.4000 mg | INTRAMUSCULAR | Status: DC | PRN

## 2015-02-23 MED ORDER — SENNOSIDES-DOCUSATE SODIUM 8.6-50 MG PO TABS
2.0000 | ORAL_TABLET | ORAL | Status: DC
  Administered 2015-02-24 (×2): 2 via ORAL
  Filled 2015-02-23 (×4): qty 2

## 2015-02-23 MED ORDER — OXYTOCIN 10 UNIT/ML IJ SOLN
40.0000 [IU] | INTRAVENOUS | Status: DC | PRN
  Administered 2015-02-23: 40 [IU] via INTRAVENOUS

## 2015-02-23 MED ORDER — SIMETHICONE 80 MG PO CHEW
80.0000 mg | CHEWABLE_TABLET | ORAL | Status: DC
  Administered 2015-02-24: 80 mg via ORAL
  Filled 2015-02-23 (×4): qty 1

## 2015-02-23 MED ORDER — BUPIVACAINE HCL (PF) 0.25 % IJ SOLN
INTRAMUSCULAR | Status: DC | PRN
  Administered 2015-02-23: 9 mL

## 2015-02-23 MED ORDER — DIPHENHYDRAMINE HCL 25 MG PO CAPS
25.0000 mg | ORAL_CAPSULE | Freq: Four times a day (QID) | ORAL | Status: DC | PRN
  Filled 2015-02-23: qty 1

## 2015-02-23 MED ORDER — MORPHINE SULFATE (PF) 0.5 MG/ML IJ SOLN
INTRAMUSCULAR | Status: DC | PRN
  Administered 2015-02-23: .2 mg via INTRATHECAL

## 2015-02-23 MED ORDER — DIBUCAINE 1 % RE OINT
1.0000 "application " | TOPICAL_OINTMENT | RECTAL | Status: DC | PRN
  Filled 2015-02-23: qty 28

## 2015-02-23 MED ORDER — BUPIVACAINE IN DEXTROSE 0.75-8.25 % IT SOLN
INTRATHECAL | Status: DC | PRN
  Administered 2015-02-23: 1.6 mL via INTRATHECAL

## 2015-02-23 SURGICAL SUPPLY — 49 items
APL SKNCLS STERI-STRIP NONHPOA (GAUZE/BANDAGES/DRESSINGS)
BARRIER ADHS 3X4 INTERCEED (GAUZE/BANDAGES/DRESSINGS) ×3 IMPLANT
BENZOIN TINCTURE PRP APPL 2/3 (GAUZE/BANDAGES/DRESSINGS) IMPLANT
BRR ADH 4X3 ABS CNTRL BYND (GAUZE/BANDAGES/DRESSINGS) ×1
CLAMP CORD UMBIL (MISCELLANEOUS) IMPLANT
CLOSURE WOUND 1/2 X4 (GAUZE/BANDAGES/DRESSINGS)
CLOTH BEACON ORANGE TIMEOUT ST (SAFETY) ×3 IMPLANT
CONTAINER PREFILL 10% NBF 15ML (MISCELLANEOUS) IMPLANT
DRAPE C SECTION CLR SCREEN (DRAPES) ×3 IMPLANT
DRSG OPSITE POSTOP 4X10 (GAUZE/BANDAGES/DRESSINGS) ×3 IMPLANT
DURAPREP 26ML APPLICATOR (WOUND CARE) ×3 IMPLANT
ELECT REM PT RETURN 9FT ADLT (ELECTROSURGICAL) ×3
ELECTRODE REM PT RTRN 9FT ADLT (ELECTROSURGICAL) ×1 IMPLANT
EXTRACTOR VACUUM M CUP 4 TUBE (SUCTIONS) IMPLANT
EXTRACTOR VACUUM M CUP 4' TUBE (SUCTIONS)
GLOVE BIOGEL PI IND STRL 7.0 (GLOVE) ×2 IMPLANT
GLOVE BIOGEL PI INDICATOR 7.0 (GLOVE) ×4
GLOVE ECLIPSE 6.5 STRL STRAW (GLOVE) ×3 IMPLANT
GOWN STRL REUS W/TWL LRG LVL3 (GOWN DISPOSABLE) ×6 IMPLANT
KIT ABG SYR 3ML LUER SLIP (SYRINGE) IMPLANT
NEEDLE HYPO 22GX1.5 SAFETY (NEEDLE) ×3 IMPLANT
NEEDLE HYPO 25X5/8 SAFETYGLIDE (NEEDLE) IMPLANT
NS IRRIG 1000ML POUR BTL (IV SOLUTION) ×3 IMPLANT
PACK C SECTION WH (CUSTOM PROCEDURE TRAY) ×3 IMPLANT
PAD OB MATERNITY 4.3X12.25 (PERSONAL CARE ITEMS) ×3 IMPLANT
RETRACTOR TRAXI PANNICULUS (MISCELLANEOUS) ×1 IMPLANT
RTRCTR C-SECT PINK 25CM LRG (MISCELLANEOUS) IMPLANT
SPONGE LAP 18X18 X RAY DECT (DISPOSABLE) ×3 IMPLANT
STAPLER VISISTAT 35W (STAPLE) ×3 IMPLANT
STRIP CLOSURE SKIN 1/2X4 (GAUZE/BANDAGES/DRESSINGS) IMPLANT
SUT CHROMIC GUT AB #0 18 (SUTURE) IMPLANT
SUT MNCRL 0 VIOLET CTX 36 (SUTURE) ×3 IMPLANT
SUT MON AB 2-0 SH 27 (SUTURE)
SUT MON AB 2-0 SH27 (SUTURE) IMPLANT
SUT MON AB 3-0 SH 27 (SUTURE)
SUT MON AB 3-0 SH27 (SUTURE) IMPLANT
SUT MON AB 4-0 PS1 27 (SUTURE) IMPLANT
SUT MONOCRYL 0 CTX 36 (SUTURE) ×6
SUT PLAIN 2 0 (SUTURE)
SUT PLAIN 2 0 XLH (SUTURE) IMPLANT
SUT PLAIN ABS 2-0 CT1 27XMFL (SUTURE) IMPLANT
SUT VIC AB 0 CT1 36 (SUTURE) ×12 IMPLANT
SUT VIC AB 2-0 CT1 27 (SUTURE) ×3
SUT VIC AB 2-0 CT1 TAPERPNT 27 (SUTURE) ×1 IMPLANT
SUT VIC AB 4-0 PS2 27 (SUTURE) IMPLANT
SYR CONTROL 10ML LL (SYRINGE) ×3 IMPLANT
TOWEL OR 17X24 6PK STRL BLUE (TOWEL DISPOSABLE) ×3 IMPLANT
TRAXI PANNICULUS RETRACTOR (MISCELLANEOUS) ×2
TRAY FOLEY CATH SILVER 14FR (SET/KITS/TRAYS/PACK) IMPLANT

## 2015-02-23 NOTE — Anesthesia Preprocedure Evaluation (Signed)
Anesthesia Evaluation  Patient identified by MRN, date of birth, ID band Patient awake    Reviewed: Allergy & Precautions, NPO status , Patient's Chart, lab work & pertinent test results  History of Anesthesia Complications Negative for: history of anesthetic complications  Airway Mallampati: III  TM Distance: >3 FB Neck ROM: Full    Dental no notable dental hx. (+) Dental Advisory Given   Pulmonary former smoker,    Pulmonary exam normal breath sounds clear to auscultation       Cardiovascular hypertension, Normal cardiovascular exam Rhythm:Regular Rate:Normal     Neuro/Psych PSYCHIATRIC DISORDERS Anxiety negative neurological ROS     GI/Hepatic negative GI ROS, Neg liver ROS,   Endo/Other  Morbid obesity  Renal/GU negative Renal ROS  negative genitourinary   Musculoskeletal negative musculoskeletal ROS (+)   Abdominal   Peds negative pediatric ROS (+)  Hematology negative hematology ROS (+)   Anesthesia Other Findings   Reproductive/Obstetrics (+) Pregnancy                             Anesthesia Physical Anesthesia Plan  ASA: III and emergent  Anesthesia Plan: Spinal   Post-op Pain Management:    Induction: Intravenous  Airway Management Planned:   Additional Equipment:   Intra-op Plan:   Post-operative Plan:   Informed Consent: I have reviewed the patients History and Physical, chart, labs and discussed the procedure including the risks, benefits and alternatives for the proposed anesthesia with the patient or authorized representative who has indicated his/her understanding and acceptance.   Dental advisory given  Plan Discussed with: CRNA  Anesthesia Plan Comments: (Nonurgent C/S for fetal intolerance to labor)        Anesthesia Quick Evaluation

## 2015-02-23 NOTE — H&P (Signed)
Rebekah Irwin is a 40 y.o. female presenting for IOL due to postdates. Pt was initially followed for chronic HTN  For which she was on HCTZ, norvasc. Pt self discontinued med. Pt was found to have proteinuria and was evaluated by nephrologist.   History OB History    Gravida Para Term Preterm AB TAB SAB Ectopic Multiple Living   2    1  1         Past Medical History  Diagnosis Date  . Allergy     Seasonal allergies   . Hypertension     No complications during pregnancy, diet controlled  . Anxiety    Past Surgical History  Procedure Laterality Date  . Breast surgery     Family History: family history includes Anuerysm in her maternal aunt; Diabetes in her father; Hypertension in her mother; Stroke in her maternal grandmother. There is no history of Cancer or Early death. Social History:  reports that she has quit smoking. Her smoking use included Cigarettes. She has never used smokeless tobacco. She reports that she drinks about 0.6 oz of alcohol per week. She reports that she does not use illicit drugs.   Prenatal Transfer Tool  Maternal Diabetes: No Genetic Screening: Normal Maternal Ultrasounds/Referrals: Normal Fetal Ultrasounds or other Referrals:  None Maternal Substance Abuse:  No Significant Maternal Medications:  None Significant Maternal Lab Results:  Lab values include: Group B Strep negative Other Comments:  AMA. hx chronic HTN, late [facility]  Review of Systems  All other systems reviewed and are negative.   Dilation: Closed Effacement (%): Thick Exam by:: Rochel Brome, RN  Blood pressure 127/77, pulse 103, temperature 97.6 F (36.4 C), temperature source Oral, resp. rate 20, height 5\' 3"  (1.6 m), weight 107.049 kg (236 lb), last menstrual period 05/13/2014. Exam Physical Exam  Constitutional: She is oriented to person, place, and time. She appears well-developed and well-nourished.  HENT:  Head: Atraumatic.  Eyes: EOM are normal.  Neck: Neck supple.   Cardiovascular: Regular rhythm.   Respiratory: Breath sounds normal.  GI: Soft.  Neurological: She is alert and oriented to person, place, and time.  Skin: Skin is warm and dry.    Prenatal labs: ABO, Rh: --/--/B POS (02/15 0045) Antibody: NEG (02/15 0045) Rubella: Immune (10/28 0000) RPR: Nonreactive (10/28 0000)  HBsAg: Negative (10/28 0000)  HIV: Non-reactive (10/28 0000)  GBS: Negative (01/06 0000)   Assessment/Plan: Postdates AMA>35 P) admit routine labs. Cytotec. IV pitocin analgesic prn  Manoj Enriquez A 02/23/2015, 3:01 AM

## 2015-02-23 NOTE — Anesthesia Postprocedure Evaluation (Signed)
Anesthesia Post Note  Patient: Rebekah Irwin  Procedure(s) Performed: Procedure(s) (LRB): CESAREAN SECTION (N/A)  Patient location during evaluation: Mother Baby Anesthesia Type: Spinal Level of consciousness: awake and alert and oriented Pain management: pain level controlled Vital Signs Assessment: post-procedure vital signs reviewed and stable Respiratory status: spontaneous breathing Cardiovascular status: blood pressure returned to baseline Postop Assessment: no headache, no backache, spinal receding, patient able to bend at knees, no signs of nausea or vomiting and adequate PO intake Anesthetic complications: no    Last Vitals:  Filed Vitals:   02/23/15 0855 02/23/15 1100  BP: 130/76 126/76  Pulse: 78 90  Temp:  36.4 C  Resp: 18 18    Last Pain:  Filed Vitals:   02/23/15 1109  PainSc: 2                  [name]

## 2015-02-23 NOTE — Addendum Note (Signed)
Addendum  created 02/23/15 1333 by Talbot Grumbling, CRNA   Modules edited: Clinical Notes   Clinical Notes:  File: [id number]

## 2015-02-23 NOTE — Anesthesia Postprocedure Evaluation (Signed)
Anesthesia Post Note  Patient: Rebekah Irwin  Procedure(s) Performed: Procedure(s) (LRB): CESAREAN SECTION (N/A)  Patient location during evaluation: PACU Anesthesia Type: Spinal Level of consciousness: awake Pain management: pain level controlled Vital Signs Assessment: post-procedure vital signs reviewed and stable Respiratory status: spontaneous breathing Cardiovascular status: stable Postop Assessment: no headache, no backache, spinal receding, patient able to bend at knees and no signs of nausea or vomiting    Last Vitals:  Filed Vitals:   02/23/15 0645 02/23/15 0700  BP: 121/79   Pulse: 96 94  Temp: 36.2 C   Resp: 24 23    Last Pain:  Filed Vitals:   02/23/15 0708  PainSc: 0-No pain                 Roberto Hlavaty JR,JOHN Ardel Jagger

## 2015-02-23 NOTE — Consult Note (Signed)
Neonatology Note:   Attendance at C-section:    I was asked by Dr. Garwin Brothers to attend this primary C/S at term due to fetal intolerance of labor. The mother is a G2P0A1 B pos, GBS neg with induction for postdates and chronic HTN, on Norvasc. ROM at delivery, fluid with thin meconium. Infant vigorous with good spontaneous cry and tone. Delayed cord clamping was done. Needed only minimal bulb suctioning. Ap 8/9. Lungs clear to ausc in DR. To CN to care of Pediatrician.   Real Cons, MD

## 2015-02-23 NOTE — Lactation Note (Addendum)
This note was copied from a baby's chart. Lactation Consultation Note Initial visit at 5 hours of age.  Mom reports a few good feedings, but she is unsure about breast feeding.  FOB and some friends are supportive, but she didn't think she would latch baby.  Discussed exclusive pumping and mom continues to work on latching.  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  Mom has many questions and LC answered all at this time.  Mom had asked for formula and RN provided mom with foley cup and 38mls of formula.  Discussed exclusive breastfeeding with mom.  Assisted with hand expression with several drops noted and easily expressed.  Mom is able to return demonstration.  Mom sitting on side of bed recovering from c/s.  Offered to assist with latch mom declines getting in to bed for comfort.  Pillows used for support.  Baby latched well in football hold on right breast.  On and off a few times and mom was able to independently latch baby.  Few swallows audible.  [facility] LC resources given and discussed.  Encouraged to feed with early cues on demand.  Early newborn behavior discussed.  Mom to call for assist as needed.  Mom did mention a breast surgery when she was 62 for a lump removal on her right breat, upper outer quadrant and denies further complication with good breast changes during pregnancy.     Patient Name: Rebekah Irwin [id number] Date: 02/23/2015 Reason for consult: Initial assessment   Maternal Data Has patient been taught Hand Expression?: Yes Does the patient have breastfeeding experience prior to this delivery?: No  Feeding Feeding Type: Breast Fed Length of feed: 10 min  LATCH Score/Interventions Latch: Repeated attempts needed to sustain latch, nipple held in mouth throughout feeding, stimulation needed to elicit sucking reflex. Intervention(s): Teach feeding cues;Waking techniques Intervention(s): Adjust position;Assist with latch;Breast  massage;Breast compression  Audible Swallowing: A few with stimulation Intervention(s): Skin to skin;Hand expression;Alternate breast massage  Type of Nipple: Everted at rest and after stimulation  Comfort (Breast/Nipple): Soft / non-tender  Problem noted: Mild/Moderate discomfort Interventions (Mild/moderate discomfort): Hand massage;Hand expression;Reverse pressue  Hold (Positioning): Assistance needed to correctly position infant at breast and maintain latch. Intervention(s): Breastfeeding basics reviewed;Support Pillows  LATCH Score: 7  Lactation Tools Discussed/Used WIC Program: No   Consult Status Consult Status: Follow-up Date: 02/24/15 Follow-up type: In-patient    Kendell Bane Justine Null 02/23/2015, 5:39 PM

## 2015-02-23 NOTE — Progress Notes (Signed)
Patient has wanted limited interruptions by staff.  States that she is tired and needs sleep.  Rn placed sign on the door, clustered work to limit interruptions, and helped with care of infant by swaddling, helping with latching, etc.  Infant has been fussy and wants to be held as well as nursed frequently.  Patient declined hand expressing and feeding colostrum at this time and requests formula.  Discussed alternative ways of giving supplement besides an artifical nipple- agreed to using a foley cup.  Supplement sheet given to [name] and amounts discussed.  Will Continue to monitor.  [place] Cellar RN

## 2015-02-23 NOTE — Anesthesia Procedure Notes (Signed)
Epidural Patient location during procedure: OR  Staffing Anesthesiologist: Aleisa Howk Performed by: anesthesiologist   Preanesthetic Checklist Completed: patient identified, site marked, surgical consent, pre-op evaluation, timeout performed, IV checked, risks and benefits discussed and monitors and equipment checked  Epidural Patient position: sitting Prep: site prepped and draped and DuraPrep Patient monitoring: continuous pulse ox and blood pressure Approach: midline Location: L3-L4 Injection technique: LOR saline  Needle:  Needle type: Tuohy  Needle gauge: 17 G Needle length: 9 cm and 9 Needle insertion depth: 10 cm Catheter type: closed end flexible Catheter size: 19 Gauge Catheter at skin depth: 15 cm Test dose: negative  Assessment Events: blood not aspirated, injection not painful, no injection resistance, negative IV test and no paresthesia  Additional Notes Patient identified. Risks/Benefits/Options discussed with patient including but not limited to bleeding, infection, nerve damage, paralysis, failed block, incomplete pain control, headache, blood pressure changes, nausea, vomiting, reactions to medication both or allergic, itching and postpartum back pain. Confirmed with bedside nurse the patient's most recent platelet count. Confirmed with patient that they are not currently taking any anticoagulation, have any bleeding history or any family history of bleeding disorders. Patient expressed understanding and wished to proceed. All questions were answered. Sterile technique was used throughout the entire procedure. Please see nursing notes for vital signs.  Warning signs of high block given to the patient including shortness of breath, tingling/numbness in hands, complete motor block, or any concerning symptoms with instructions to call for help. Patient was given instructions on fall risk and not to get out of bed. All questions and concerns addressed.  Spinal was  attempted first. On first pass with spinal needle hubbed, clear CSF return. However, when hooking up to syringe, unable to aspirate with good flow. Disconnected and advanced needle slightly with stylet and again, good flow but unable to aspirate. Removed spinal needle and attempted again. Easily obtained clear CSF with needle hubbed again but unable to aspirate. Decieded to move to a CSE. On first pass, LOR obtained at 10cm and spinal needle advanced through [place]. Clear CSF return and this time, able to aspirate. Gave spinal dose and withdrew needle. Advanced epidural catheter easily through and negative aspiration.

## 2015-02-23 NOTE — Brief Op Note (Signed)
02/23/2015  6:01 AM  PATIENT:  Rebekah Irwin  40 y.o. female  PRE-OPERATIVE DIAGNOSIS:  Fetal intolerance to labor, postdates  POST-OPERATIVE DIAGNOSIS:  Fetal intolerance to labor, postdates, SS fibroid  PROCEDURE:  Primary Cesarean section, Buddy Duty hysterotomy  SURGEON:  Surgeon(s) and Role:    * Servando Salina, MD - Primary  PHYSICIAN ASSISTANT:   ASSISTANTS: Laury Deep, CNM   ANESTHESIA:   spinal   Findings: live female loop of cord next to chest and arm. Light mec, nl tubes and ovaries, left posterior  SS fibroids( larger one just post to proximal uteroovarian ligament  EBL:  Total I/O In: 1900 [I.V.:1900] Out: 800 [Urine:200; Blood:600]  BLOOD ADMINISTERED:none  DRAINS: none   LOCAL MEDICATIONS USED:  MARCAINE     SPECIMEN:  No Specimen  DISPOSITION OF SPECIMEN:  N/A  COUNTS:  YES  TOURNIQUET:  * No tourniquets in log *  DICTATION: .Other Dictation: Dictation Number [id number]  PLAN OF CARE: Admit to inpatient   PATIENT DISPOSITION:  PACU - hemodynamically stable.   Delay start of Pharmacological VTE agent (>24hrs) due to surgical blood loss or risk of bleeding: no

## 2015-02-23 NOTE — Progress Notes (Signed)
Called by RN for prolonged deceleration x 8 mins and late decleration  Tracing reviewed> baseline 120 (+) accel to 135-140 pre-insertion of cytotec @ 2am Pt subsequently had variable decel down to 90 x 1 1/2 mins and later  Fetal heart rate decel down to 60's x 8 mins with Return to baseline change of 150 (+) variability.  Pt continued to have another variable/late after ctx Ctx irreg VE closed/thick/-3 off center  IMP: Fetal intolerance to labor AMA>35 Given the above exam and tracing before labor with inability to further  Assess fetal wellbeing Pt advised of need for C/S.  C/S reviewed with pt including infection, bleeding, injury to surrounding organ structures, internal scar tissue. Or notified

## 2015-02-23 NOTE — Transfer of Care (Signed)
Immediate Anesthesia Transfer of Care Note  Patient: Rebekah Irwin  Procedure(s) Performed: Procedure(s): CESAREAN SECTION (N/A)  Patient Location: PACU  Anesthesia Type:Spinal and Epidural  Level of Consciousness: awake, alert  and oriented  Airway & Oxygen Therapy: Patient Spontanous Breathing  Post-op Assessment: Report given to RN and Post -op Vital signs reviewed and stable  Post vital signs: Reviewed and stable  Last Vitals:  Filed Vitals:   02/23/15 0315 02/23/15 0323  BP:  119/75  Pulse: 93 100  Temp:    Resp:      Complications: No apparent anesthesia complications

## 2015-02-24 ENCOUNTER — Encounter (HOSPITAL_COMMUNITY): Payer: Self-pay | Admitting: Obstetrics and Gynecology

## 2015-02-24 LAB — CBC
HCT: 29.9 % — ABNORMAL LOW (ref 36.0–46.0)
Hemoglobin: 10.4 g/dL — ABNORMAL LOW (ref 12.0–15.0)
MCH: 28.2 pg (ref 26.0–34.0)
MCHC: 34.8 g/dL (ref 30.0–36.0)
MCV: 81 fL (ref 78.0–100.0)
Platelets: 309 10*3/uL (ref 150–400)
RBC: 3.69 MIL/uL — ABNORMAL LOW (ref 3.87–5.11)
RDW: 13.9 % (ref 11.5–15.5)
WBC: 8.9 10*3/uL (ref 4.0–10.5)

## 2015-02-24 LAB — RPR: RPR: NONREACTIVE

## 2015-02-24 NOTE — Clinical Social Work Maternal (Signed)
CLINICAL SOCIAL WORK MATERNAL/CHILD NOTE  Patient Details  Name: Rebekah Irwin MRN: [id number] Date of Birth: 1975-11-07  Date:  02/24/2015  Clinical Social Worker Initiating Note:  Lucita Ferrara MSW, LCSW Date/ Time Initiated:  02/24/15/1045     Child's Name:  Rebekah Irwin   Legal Guardian:  Graciella Freer and Verlene Mayer  Need for Interpreter:  None   Date of Referral:  02/23/15     Reason for Referral:  History of anxiety and depression  Referral Source:  [facility]   Address:  [place], [place] [address]  Phone number:  [phone]   Household Members:  Sister, niece   Natural Supports (not living in the home):  Extended Family, Immediate Family, FOB   Professional Supports: None   Employment: Full-time   Type of Work: At [employer]   Education:    N/A  Museum/gallery curator Resources:  Multimedia programmer   Other Resources:    None identified  Cultural/Religious Considerations Which May Impact Care:  None reported  Strengths:  Ability to meet basic needs , Home prepared for child , Pediatrician chosen    Risk Factors/Current Problems:   1. Mental Health Concerns: [name] presents with a history of anxiety in August 2015. [name] denied acute symptoms during the pregnancy, and denied any concerns secondary to her mental health as she transitions postpartum.    Cognitive State:  Able to Concentrate , Alert , Goal Oriented , Linear Thinking , Insightful    Mood/Affect:  Happy , Animated, Bright    CSW Assessment:  CSW received request for consult due to [name] presenting with a history of anxiety and depression.  [name] presented as easily engaged and receptive to the visit. The [name] displayed a full range in affect and was in a pleasant mood.  [name] provided consent for her mother to remain in the room during the visit. [name] was observed to be smiling, and slowly increasing her mobility.  [name]'s sister was providing care to the infant during the  assessment.   [name] endorsed feelings of happiness and excitement secondary to the infant's birth.  [name] stated that she had an unanticipated C-Section due to the infant's non-reassuring heart rate. She identified this event as "scary", but shared that she felt reassured due to the support she received from the hospital and due to everything turning out okay.  [name] stated that she feels like she is recovering well and is adjusting to the realization that she will have a longer recovery time. [name] stated that she feels well supported by her support system as she recovers.  [name] stated that she is supplementing her breast milk with formula. She discussed that she feels okay with the decision since the infant is small and she wants to ensure that the infant is receiving sufficient calories.  Per [name], she also knows that this will provide her with additional time to learn and become adjusted to breastfeed.  [name] stated that she knew breastfeeding can be difficult and that she would need to approach it with patience. She shared that she wants to try to feed, will focus on what is within her control, but will also be okay if formula is required since she wants the infant to be healthy.   [name] confirmed history of anxiety that was noted in her chart in August 2015.  [name] stated that she was prescribed Xanax, but shared that it caused her to feel "depressed". [name] stated that she did not continue  the medication.  Per [name], her symptoms remained manageable, and did not occur on a frequent basis.  [name] denied changes in her mental health during the pregnancy.  [name] stated that she still experiences occasional anxiety, but denied belief that it negatively impacted her daily routine. She shared that the predominant mood during the pregnancy was "happiness". [name] stated that she is "nervous" she will experience postpartum depression, and discussed for this reason, she has educated herself on the symptoms and strategies to help protect  her.  CSW reviewed education on the [employer] and Perinatal Mood Disorders, and [name] denied questions, concerns, or needs. CSW reviewed strategies to support her mental health, and what to do if she experiences symptoms. [name] agreed to closely monitor and to follow up with her medical provider if she notes onset of symptoms. [name] also expressed appreciation in the Feelings After Birth support group if needs arise as well.   [name] expressed appreciation for the visit and support, and agreed to contact CSW if questions, concerns, or needs arise.  CSW Plan/Description:   1. Patient/Family Education-- Perinatal mood and anxiety disorders 2. Information/Referral to [employer]-- Feelings After Birth support group 3.No Further Intervention Required/No Barriers to Discharge    Sharyl Nimrod 02/24/2015, 11:22 AM

## 2015-02-24 NOTE — Clinical Documentation Improvement (Signed)
OB/GYN Please update your documentation within the medical record to reflect your response to this query. Thank you  Based on the clinical findings below, please document any associated diagnoses/conditions the patient has or may have.   Weeks of Gestation  Other  Clinically Undetermined  Supporting Information: 02/23/15 H&P..."41 y.o. female presenting for IOL due to postdates.".Marland KitchenMarland Kitchen  Please exercise your independent, professional judgment when responding. A specific answer is not anticipated or expected.  Thank You, Ermelinda Das, RN, BSN, [place] Certified Clinical Documentation Specialist [place]: Health Information Management [phone]

## 2015-02-24 NOTE — Lactation Note (Signed)
This note was copied from a baby's chart. Lactation Consultation Note  Patient Name: Rebekah Irwin [id number] Date: 02/24/2015 Reason for consult: Follow-up assessment  Baby at 36 hr of life and mom is reporting L nipple is sore. No skin break down noted at this time. Mom likes to sit on the side of the bed with baby on top of pillows while she is feeding. Showed mom how to get a deeper latch and she reported that it felt more comfortable. Encouraged mom to use the same techniques when she latches baby to the L breast. Encouraged mom to keep latching to that side. She was thinking about switching to pump to feed. She has her personal DEBP in the room and a Foley cup. Demonstrated manual expression, colostrum noted bilaterally. Reviewed nipple care and breast changes. She is aware of OP services and support group. She will call for bf help as needed.     Maternal Data    Feeding Feeding Type: Breast Fed Length of feed: 20 min  LATCH Score/Interventions Latch: Repeated attempts needed to sustain latch, nipple held in mouth throughout feeding, stimulation needed to elicit sucking reflex. Intervention(s): Skin to skin Intervention(s): Adjust position  Audible Swallowing: Spontaneous and intermittent Intervention(s): Hand expression  Type of Nipple: Everted at rest and after stimulation  Comfort (Breast/Nipple): Filling, red/small blisters or bruises, mild/mod discomfort  Problem noted: Mild/Moderate discomfort Interventions (Mild/moderate discomfort): Hand expression  Hold (Positioning): No assistance needed to correctly position infant at breast. Intervention(s): Position options;Support Pillows  LATCH Score: 8  Lactation Tools Discussed/Used     Consult Status Consult Status: Follow-up Date: 02/25/15 Follow-up type: In-patient    Denzil Hughes 02/24/2015, 5:27 PM

## 2015-02-24 NOTE — Op Note (Signed)
NAME:  Rebekah Irwin, Rebekah Irwin NO.:  [id number]  MEDICAL RECORD NO.:  [id number]  LOCATION:  [facility]                          FACILITY:  [facility]  PHYSICIAN:  Servando Salina, M.D.DATE OF BIRTH:  October 09, 1975  DATE OF PROCEDURE:  02/23/2015 DATE OF DISCHARGE:                              OPERATIVE REPORT   PREOPERATIVE DIAGNOSIS:  Fetal intolerance to labor, post dates.  PROCEDURES:  Primary cesarean section, Kerr hysterotomy.  POSTOPERATIVE DIAGNOSIS:  Fetal intolerance to labor, post dates.  ANESTHESIA:  Spinal.  SURGEON:  Servando Salina, M.D.  ASSISTANT:  Laury Deep, CNM.  DESCRIPTION OF PROCEDURE:  Under adequate spinal anesthesia, the patient was placed in the supine position with a left lateral tilt.  She was sterilely prepped and draped in usual fashion.  Indwelling Foley catheter was sterilely placed.  0.25% Marcaine was injected along the planned Pfannenstiel skin incision site.  Pfannenstiel skin incision was then made, carried down to the rectus fascia.  The rectus fascia opened transversely.  The rectus fascia was bluntly and sharply dissected off the rectus muscle in superior and inferior fashion.  The rectus muscle was split in midline.  The parietal peritoneum was entered sharply and extended.  An Alexis self-retaining retractor was then placed.  The vesicouterine peritoneum was opened transversely.  The bladder was gently dissected off the lower uterine segment and displaced inferiorly. A curvilinear low transverse uterine incision was then made and extended with bandage scissors.  Slight meconium fluid was noted.  Loop of cord next to the baby's chest was noted, which was reduced, subsequent delivery of a live female with cord around the foot and leg, then arm was noted.  The baby was bulb suctioned on the abdomen.  Cord was clamped at 1 minute, subsequently cut.  Baby was subsequently transferred to the awaiting pediatrician, who assigned Apgars  of 8 and 9 at 1 and 5 minutes.  Placenta was spontaneous and manually removed, cord blood collection was done, uterine cavity was cleaned of debris. Uterine incision had no extension, was closed in 2 layers, the first layer with 0 Monocryl running locked stitch, second layer imbricating with 0 Monocryl suture.  Abdomen was irrigated and suctioned of debris.  Interceed was placed in a transverse fashion overlying the uterine scar.  The retractor was removed.  Normal tubes and ovaries were noted bilaterally.  Left posterior subserosal fibroid x 2 was noted.  The other piece of interceed was placed in a vertical fashion.  The parietal peritoneum was then closed with 2-0 Vicryl.  The rectus fascia was closed with 0 Vicryl x2.  The subcutaneous area was irrigated, small bleeders cauterized.  Interrupted 2-0 plain sutures placed and the skin approximated with Ethicon staples.  SPECIMENS:  Placenta not sent to Pathology.  ESTIMATED BLOOD LOSS:  600 mL.  INTRAOPERATIVE FLUID:  2 L.  URINE OUTPUT:  200 mL clear yellow urine.  COUNTS:  Sponge and instrument counts x2 was correct.  COMPLICATION:  None.  The patient tolerated the procedure well and was transferred to recovery room in stable condition.  The baby had been placed skin to skin in the OR.     Servando Salina, M.D.     [place]/MEDQ  D:  02/23/2015  T:  02/23/2015  Job:  [id number]

## 2015-02-24 NOTE — Progress Notes (Signed)
Patient ID: Rebekah Irwin, female   DOB: Sep 19, 1975, 40 y.o.   MRN: [id number] Subjective: S/P Primary  Cesarean Delivery d/t / Postdates IOL / Fetal Intolerance to Labor  POD# 1 Information for the patient's newborn:  Nazyah, [place]unter [id number]  female  Reports feeling a little sore, but well. Desires early discharge home tomorrow. Feeding: breast Patient reports tolerating PO.  Breast symptoms: none Pain controlled with ibuprofen (OTC) and narcotic analgesics including Percocet Denies HA/SOB/C/P/N/V/dizziness. Flatus present. (+) BM. She reports vaginal bleeding as normal, without clots.  She is ambulating, urinating without difficult.     Objective:   VS:  Filed Vitals:   02/23/15 1432 02/23/15 1928 02/24/15 0004 02/24/15 0415  BP: 128/83 126/56 137/72 128/62  Pulse: 101 100 101 100  Temp:  98.1 F (36.7 C) 97.9 F (36.6 C) 98 F (36.7 C)  TempSrc:  Axillary Oral Oral  Resp:  18 18 18   Height:      Weight:      SpO2:  100% 100% 99%     Intake/Output Summary (Last 24 hours) at 02/24/15 0955 Last data filed at 02/24/15 0400  Gross per 24 hour  Intake 1962.5 ml  Output   1075 ml  Net  887.5 ml        Recent Labs  02/23/15 0045 02/24/15 0550  WBC 8.9 8.9  HGB 11.2* 10.4*  HCT 32.6* 29.9*  PLT 337 309     Blood type: --/--/B POS (02/15 0045)  Rubella: Immune (10/28 0000)     Physical Exam:   General: alert, cooperative and no distress  CV: Regular rate and rhythm, S1S2 present or without murmur or extra heart sounds  Resp: clear  Abdomen: soft, nontender, normal bowel sounds  Incision: clean, dry, intact and skin well-approximated with staples  Uterine Fundus: firm, 1 FB below umbilicus, nontender  Lochia: minimal  Ext: edema 1+ and Homans sign is negative, no sign of DVT   Assessment/Plan: 40 y.o.   POD# 1.  S/P Cesarean Delivery.  Indications: postdates / fetal intolerance of labor                Principal Problem:   Postpartum care  following cesarean delivery (2/15) Active Problems:   Pregnancy  Doing well, stable.               Regular diet as tolerated D/C saline lock Ambulate 2-3 times in hallway Routine post-op care Early Discharge home tomorrow  Laury Deep, M, MSN, CNM 02/24/2015, 9:35 AM

## 2015-02-25 MED ORDER — IBUPROFEN 600 MG PO TABS: 600.0000 mg | ORAL_TABLET | Freq: Four times a day (QID) | ORAL | Status: DC

## 2015-02-25 MED ORDER — OXYCODONE-ACETAMINOPHEN 5-325 MG PO TABS: 1.0000 | ORAL_TABLET | ORAL | Status: DC | PRN

## 2015-02-25 NOTE — Lactation Note (Signed)
This note was copied from a baby's chart. Lactation Consultation Note  Baby sucking on pacifier upon entering.  Pacifier use not recommended at this time. Provided education. Mother states her nipples are so sore she was unable to breastfeed the last feeding and gave formula. Noted slight crack on L nipple.  Provided mother w/ comfort gels and encourged ebm and coconut oil. Provided mother w/ hand pump and larger flanges.  #27seems to fit but due to large nipple and soreness provided mother with #30 flanges also. Discussed supply and demand and breastfeeding on one breast at a time for the next couple of feedings until mother can latch on both due to soreness. Reviewed engorgement care and monitoring voids/stools. Encouraged her to call [facility] with next feeding to check latch.  Patient Name: Rebekah Irwin [id number] Date: 02/25/2015 Reason for consult: Follow-up assessment   Maternal Data    Feeding Feeding Type: Formula Length of feed: 10 min  LATCH Score/Interventions Latch:  (Unable to witness feeding)                    Lactation Tools Discussed/Used     Consult Status Consult Status: Complete    Carlye Grippe 02/25/2015, 9:28 AM

## 2015-02-25 NOTE — Progress Notes (Signed)
POD # 2  Subjective: Pt reports feeling well, desires early discharge/ Pain controlled with Motrin and Percocet Tolerating po/Voiding without problems/ No n/v/ Flatus present, +BM Activity: ad lib Bleeding is light Newborn info:  Information for the patient's newborn:  Takaria, [place]roc [id number]  female  Feeding: breast  Objective: VS: VS:  Filed Vitals:   02/24/15 0004 02/24/15 0415 02/24/15 1809 02/25/15 0528  BP: 137/72 128/62 134/84 121/70  Pulse: 101 100 103 93  Temp: 97.9 F (36.6 C) 98 F (36.7 C) 97.5 F (36.4 C) 97.8 F (36.6 C)  TempSrc: Oral Oral Oral Oral  Resp: 18 18 18 18   Height:      Weight:      SpO2: 100% 99%  98%    I&O: Intake/Output      02/16 0701 - 02/17 0700 02/17 0701 - 02/18 0700   P.O.     I.V. (mL/kg)     Total Intake(mL/kg)     Urine (mL/kg/hr) 1100 (0.4)    Total Output 1100     Net -1100            LABS:  Recent Labs  02/23/15 0045 02/24/15 0550  WBC 8.9 8.9  HGB 11.2* 10.4*  PLT 337 309   Blood type: --/--/B POS (02/15 0045) Rubella: Immune (10/28 0000)           Physical Exam:  General: alert, cooperative and no distress CV: Regular rate and rhythm Resp: CTA bilaterally Abdomen: soft, nontender, normal bowel sounds Incision: Covered with Tegaderm and honeycomb dressing; no significant drainage, edema, bruising, or erythema; well approximated with staples. Uterine Fundus: firm, below umbilicus, nontender Lochia: minimal Ext: edema 1+ BLE and Homans sign is negative, no sign of DVT   Assessment: POD # 2/ G2P1011/ S/P C/Section d/t fetal intolerence to labor Doing well and stable for discharge home  Plan: Discharge home RX's: Ibuprofen 600mg  po Q 6 hrs prn pain #30 Refill x 1 Percocet 5/325 1 - 2 tabs po every 4 hrs prn pain #30 Refill x 0 Follow up in 5 days for staple removal at [facility] Follow up in 6 wks for postpartum check at [facility] Ob/Gyn booklet given    Signed: Julianne Handler, Delane Ginger, MSN,  CNM 02/25/2015, 8:31 AM

## 2015-02-25 NOTE — Discharge Summary (Signed)
DISCHARGE SUMMARY:  Patient ID: Rebekah Irwin MRN: [id number] DOB/AGE: 1975-02-04 40 y.o.  Admit date: 02/23/2015 Admission Diagnoses: 40.[redacted] weeks gestation, post dates   Discharge date: 02/25/2015 Discharge Diagnoses: S/P C/S on 02/23/15        Prenatal history: G2P1011   EDC: 02/17/2015, by Last Menstrual Period  Prenatal care at [facility] since [redacted] wks gestation. Primary provider: Dr. Garwin Brothers Prenatal course complicated by AMA, late [facility], chronic HTN-off meds, proteinuria w/renal w/u, obesity  Prenatal labs: ABO, Rh: --/--/B POS (02/15 0045)  Antibody: NEG (02/15 0045) Rubella: Immune RPR: Non Reactive (02/15 0045)  HBsAg: Negative (10/28 0000)  HIV: Non-reactive (10/28 0000)  GBS: Negative (01/06 0000)  GTT: 108  Medical / Surgical History :  Past medical history:  Past Medical History  Diagnosis Date  . Allergy     Seasonal allergies   . Hypertension     No complications during pregnancy, diet controlled  . Anxiety   . Postpartum care following cesarean delivery (2/15) 02/23/2015    Past surgical history:  Past Surgical History  Procedure Laterality Date  . Breast surgery    . Cesarean section N/A 02/23/2015    Procedure: CESAREAN SECTION;  Surgeon: Servando Salina, MD;  Location: [facility];  Service: Obstetrics;  Laterality: N/A;     Medications on Admission: Prescriptions prior to admission  Medication Sig Dispense Refill Last Dose  . Prenatal Vit-Fe Fumarate-FA (PRENATAL VITAMIN PO) Take 1 tablet by mouth daily.    02/22/2015 at Unknown time  . DiphenhydrAMINE HCl (BENADRYL PO) Take 1 tablet by mouth at bedtime as needed (sleep).   02/21/2015  . magnesium oxide (MAG-OX) 400 (241.3 MG) MG tablet Take 1 tablet (400 mg total) by mouth daily. (Patient not taking: Reported on 02/23/2015) 90 tablet 2   . ranitidine (ZANTAC) 150 MG tablet Take 1 tablet (150 mg total) by mouth at bedtime. (Patient not taking: Reported on 02/23/2015) 90 tablet 2      Allergies: Review of patient's allergies indicates no known allergies.   Intrapartum Course:  Admitted for IOL, Cytotec, fetal intolerance to labor, urgent CS.  Postpartum Course: Uncomplicated, discharged home on POD #2.   Physical Exam:   VSS: Blood pressure 121/70, pulse 93, temperature 97.8 F (36.6 C), temperature source Oral, resp. rate 18, height 5\' 3"  (1.6 m), weight 107.049 kg (236 lb), last menstrual period 05/13/2014, SpO2 98 %, unknown if currently breastfeeding.  LABS:  Recent Labs  02/23/15 0045 02/24/15 0550  WBC 8.9 8.9  HGB 11.2* 10.4*  PLT 337 309    General: alert and oriented x3 Heart: RRR Lungs: CTA bilaterally GI: soft, non-tender, non-distended, BS x4 Lochia: small Uterus: firm below umbilicus Incision: well approximated with staples / honeycomb dressing-no significant erythema, drainage, or edema Extremities: 1+BLE edema, Homans neg   Newborn Data Live born female  Birth Weight: 6 lb 4.5 oz (2850 g) APGAR: 8, 9  See operative report for further details  Home with mother.  Discharge Instructions:  Wound Care: keep clean and dry  Postpartum Instructions: [facility] discharge booklet - instructions reviewed Medications:    Medication List    STOP taking these medications        BENADRYL PO     magnesium oxide 400 (241.3 Mg) MG tablet  Commonly known as:  MAG-OX     ranitidine 150 MG tablet  Commonly known as:  ZANTAC      TAKE these medications  ibuprofen 600 MG tablet  Commonly known as:  ADVIL,MOTRIN  Take 1 tablet (600 mg total) by mouth every 6 (six) hours.     oxyCODONE-acetaminophen 5-325 MG tablet  Commonly known as:  PERCOCET/ROXICET  Take 1-2 tablets by mouth every 4 (four) hours as needed for severe pain.     PRENATAL VITAMIN PO  Take 1 tablet by mouth daily.            Follow-up Information    Follow up with Letticia Bhattacharyya A, MD. Schedule an appointment as soon as possible for a visit in 5  days.   Specialty:  Obstetrics and Gynecology   Why:  staple removal with RN   Contact information:   [address] [place] [address] [phone]       Follow up with Odesser Tourangeau A, MD. Schedule an appointment as soon as possible for a visit in 6 weeks.   Specialty:  Obstetrics and Gynecology   Contact information:   [address] [place] [address] [phone]         Signed: Julianne Handler, Delane Ginger MSN, CNM 02/25/2015, 8:34 AM

## 2015-03-08 ENCOUNTER — Encounter (HOSPITAL_COMMUNITY): Payer: Self-pay | Admitting: Obstetrics and Gynecology

## 2015-03-10 MED FILL — IBUPROFEN 600 MG TABLET: 600 | 7 days supply | Qty: 30 | Fill #0

## 2015-03-11 ENCOUNTER — Ambulatory Visit (INDEPENDENT_AMBULATORY_CARE_PROVIDER_SITE_OTHER): Payer: 59 | Admitting: Emergency Medicine

## 2015-03-11 VITALS — BP 122/80 | HR 80 | Temp 98.1°F | Resp 14 | Ht 62.25 in | Wt 219.0 lb

## 2015-03-11 DIAGNOSIS — K047 Periapical abscess without sinus: Secondary | ICD-10-CM | POA: Diagnosis not present

## 2015-03-11 MED ORDER — AMOXICILLIN 875 MG PO TABS: 875.0000 mg | ORAL_TABLET | Freq: Two times a day (BID) | ORAL | Status: DC

## 2015-03-11 MED ORDER — OXYCODONE-ACETAMINOPHEN 5-325 MG PO TABS: 1.0000 | ORAL_TABLET | ORAL | Status: DC | PRN

## 2015-03-11 MED FILL — AMOXICILLIN 875 MG TABLET: 875 | 10 days supply | Qty: 20 | Fill #0

## 2015-03-11 MED FILL — OXYCODONE/APAP 5-325: 5-325 | 2 days supply | Qty: 10 | Fill #0

## 2015-03-11 NOTE — Patient Instructions (Signed)
Please call your dentist on Monday. If you have to take a Percocet then discard your next breast milk.

## 2015-03-11 NOTE — Progress Notes (Signed)
By signing my name below, I, Raven Small, attest that this documentation has been prepared under the direction and in the presence of Arlyss Queen, MD.  Electronically Signed: Thea Alken, ED Scribe. 03/11/2015. 10:51 AM.  Chief Complaint:  Chief Complaint  Patient presents with  . Dental Pain    Right side x 1 day   HPI: Rebekah Irwin is a 40 y.o. female who reports to [facility] today complaining of right tooth pain. Pt states she cracked her tooth last week. She developed worsening pain last night and woke up this morning with swelling. She took ibuprofen without relief to pain. She tried to contact her dentist but their office is not open today. Pt recently gave birth 2 weeks ago and is currently breast feeding. She does occasionally bottle feed.  States she had an emergent csection.   Past Medical History  Diagnosis Date  . Allergy     Seasonal allergies   . Hypertension     No complications during pregnancy, diet controlled  . Anxiety   . Postpartum care following cesarean delivery (2/15) 02/23/2015   Past Surgical History  Procedure Laterality Date  . Breast surgery    . Cesarean section N/A 02/23/2015    Procedure: CESAREAN SECTION;  Surgeon: Servando Salina, MD;  Location: [facility];  Service: Obstetrics;  Laterality: N/A;   Social History   Social History  . Marital Status: Single    Spouse Name: N/A  . Number of Children: 0  . 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 Smoking status: Former Smoker    Types: Cigarettes  . Smokeless tobacco: Never Used  . Alcohol Use: 0.6 oz/week    1 Glasses of wine per week  . Drug Use: No  . Sexual Activity: Yes    Birth Control/ Protection: Condom   Other Topics Concern  . None   Social History Narrative   Regular exercise-no   Caffeine Use-yes   Family History  Problem Relation Age of Onset  . Hypertension Mother   . Diabetes Father   . Anuerysm Maternal  Aunt   . Stroke Maternal Grandmother   . Cancer Neg Hx   . Early death Neg Hx    No Known Allergies Prior to Admission medications   Medication Sig Start Date End Date Taking? Authorizing Provider  Prenatal Vit-Fe Fumarate-FA (PRENATAL VITAMIN PO) Take 1 tablet by mouth daily.    Yes Historical Provider, MD  ibuprofen (ADVIL,MOTRIN) 600 MG tablet Take 1 tablet (600 mg total) by mouth every 6 (six) hours. Patient not taking: Reported on 03/11/2015 02/25/15   Graciela Husbands, CNM  oxyCODONE-acetaminophen (PERCOCET/ROXICET) 5-325 MG tablet Take 1-2 tablets by mouth every 4 (four) hours as needed for severe pain. Patient not taking: Reported on 03/11/2015 02/25/15   Graciela Husbands, CNM     ROS: The patient denies fevers, chills, night sweats, unintentional weight loss, chest pain, palpitations, wheezing, dyspnea on exertion, nausea, vomiting, abdominal pain, dysuria, hematuria, melena, numbness, weakness, or tingling.   All other systems have been reviewed and were otherwise negative with the exception of those mentioned in the HPI and as above.    PHYSICAL EXAM: Filed Vitals:   03/11/15 1008 03/11/15 1034  BP: 140/82 122/80  Pulse: 80   Temp: 98.1 F (36.7 C)   Resp: 14    Body mass index is 39.74 kg/(m^2).   General: Alert, no  acute distress HEENT:  Normocephalic, atraumatic, oropharynx patent. Right upper 1st molar premolar broken at gum line with swelling to the surrounding tissue.  Eye: Juliette Mangle Scnetx Cardiovascular:  Regular rate and rhythm, no rubs murmurs or gallops.  No Carotid bruits, radial pulse intact. No pedal edema.  Respiratory: Clear to auscultation bilaterally.  No wheezes, rales, or rhonchi.  No cyanosis, no use of accessory musculature Abdominal: No organomegaly, abdomen is soft and non-tender, positive bowel sounds.  No masses. Musculoskeletal: Gait intact. No edema, tenderness Skin: No rashes. Neurologic: Facial musculature symmetric. Psychiatric: Patient acts  appropriately throughout our interaction. Lymphatic: No cervical or submandibular lymphadenopathy   ASSESSMENT/PLAN: Patient has an abscessed tooth. She will be on amoxicillin twice a day. If she has to take pain medicine she will discard her milk. She will follow-up with the dentist on Monday.I personally performed the services described in this documentation, which was scribed in my presence. The recorded information has been reviewed and is accurate.   Gross sideeffects, risk and benefits, and alternatives of medications d/w patient. Patient is aware that all medications have potential sideeffects and we are unable to predict every sideeffect or drug-drug interaction that may occur.  Arlyss Queen MD 03/11/2015 10:48 AM

## 2015-04-21 DIAGNOSIS — Z130 Encounter for screening for diseases of the blood and blood-forming organs and certain disorders involving the immune mechanism: Secondary | ICD-10-CM | POA: Diagnosis not present

## 2015-04-21 DIAGNOSIS — Z30430 Encounter for insertion of intrauterine contraceptive device: Secondary | ICD-10-CM | POA: Diagnosis not present

## 2015-04-21 DIAGNOSIS — N854 Malposition of uterus: Secondary | ICD-10-CM | POA: Diagnosis not present

## 2015-04-21 LAB — CBC AND DIFFERENTIAL
HCT: 34 — AB (ref 36–46)
HCT: 37 (ref 36–46)
Hemoglobin: 11.8 — AB (ref 12.0–16.0)
Hemoglobin: 12.4 (ref 12.0–16.0)
Platelets: 343 10*3/uL (ref 150–400)
Platelets: 349 10*3/uL (ref 150–400)
WBC: 5.1

## 2015-04-21 LAB — CBC
RBC: 4.19 (ref 3.87–5.11)
RBC: 4.65 (ref 3.87–5.11)

## 2015-08-08 ENCOUNTER — Encounter: Payer: Self-pay | Admitting: Internal Medicine

## 2015-08-11 ENCOUNTER — Encounter: Payer: 59 | Admitting: Internal Medicine

## 2015-08-23 ENCOUNTER — Ambulatory Visit (INDEPENDENT_AMBULATORY_CARE_PROVIDER_SITE_OTHER): Payer: 59 | Admitting: Internal Medicine

## 2015-08-23 ENCOUNTER — Encounter: Payer: Self-pay | Admitting: Internal Medicine

## 2015-08-23 VITALS — BP 132/88 | HR 104 | Temp 98.1°F | Ht 62.0 in | Wt 212.5 lb

## 2015-08-23 DIAGNOSIS — F411 Generalized anxiety disorder: Secondary | ICD-10-CM | POA: Diagnosis not present

## 2015-08-23 DIAGNOSIS — I10 Essential (primary) hypertension: Secondary | ICD-10-CM | POA: Diagnosis not present

## 2015-08-23 DIAGNOSIS — H1013 Acute atopic conjunctivitis, bilateral: Secondary | ICD-10-CM

## 2015-08-23 MED ORDER — OLOPATADINE HCL 0.2 % OP SOLN: 1.0000 [drp] | Freq: Every day | OPHTHALMIC | 0 refills | Status: DC

## 2015-08-23 MED ORDER — SERTRALINE HCL 50 MG PO TABS: ORAL_TABLET | ORAL | 3 refills | Status: DC

## 2015-08-23 MED FILL — SERTRALINE HCL 50 MG TABLET: 50 | 30 days supply | Qty: 30 | Fill #0

## 2015-08-23 MED FILL — OLOPATADINE HCL 0.2% EYE DR: 0.2 | 25 days supply | Qty: 3 | Fill #0

## 2015-08-23 NOTE — Patient Instructions (Signed)
Allergic Conjunctivitis Allergic conjunctivitis is inflammation of the clear membrane that covers the white part of your eye and the inner surface of your eyelid (conjunctiva), and it is caused by allergies. The blood vessels in the conjunctiva become inflamed, and this causes the eye to become red or pink, and it often causes itchiness in the eye. Allergic conjunctivitis cannot be spread by one person to another person (noncontagious). CAUSES This condition is caused by an allergic reaction. Common causes of an allergic reaction (allergens) include:  Dust.  Pollen.  Mold.  Animal dander or secretions. RISK FACTORS This condition is more likely to develop if you are exposed to high levels of allergens that cause the allergic reaction. This might include being outdoors when air pollen levels are high or being around animals that you are allergic to. SYMPTOMS Symptoms of this condition may include:  Eye redness.  Tearing of the eyes.  Watery eyes.  Itchy eyes.  Burning feeling in the eyes.  Clear drainage from the eyes.  Swollen eyelids. DIAGNOSIS This condition may be diagnosed by medical history and physical exam. If you have drainage from your eyes, it may be tested to rule out other causes of conjunctivitis. TREATMENT Treatment for this condition often includes medicines. These may be eye drops, ointments, or oral medicines. They may be prescription medicines or over-the-counter medicines. HOME CARE INSTRUCTIONS  Take or apply medicines only as directed by your health care provider.  Do not touch or rub your eyes.  Do not wear contact lenses until the inflammation is gone. Wear glasses instead.  Do not wear eye makeup until the inflammation is gone.  Apply a cool, clean washcloth to your eye for 10-20 minutes, 3-4 times a day.  Try to avoid whatever allergen is causing the allergic reaction. SEEK MEDICAL CARE IF:  Your symptoms get worse.  You have pus draining  from your eye.  You have new symptoms.  You have a fever.   This information is not intended to replace advice given to you by your health care provider. Make sure you discuss any questions you have with your health care provider.   Document Released: 03/17/2002 Document Revised: 01/15/2014 Document Reviewed: 10/06/2013 Elsevier Interactive Patient Education 2016 Elsevier Inc.  

## 2015-08-23 NOTE — Progress Notes (Signed)
Subjective:    Patient ID: Rebekah Irwin, female    DOB: Oct 10, 1975, 40 y.o.   MRN: [id number]  HPI  Pt presents to the clinic today for follow-up of her blood pressure.  She reports her Ob/Gyn had her stop all medications while she was pregnant, and she wants to make sure her BP is okay after having the baby.  She was previously on Norvasc 10 mg and HCTZ 25 mg.  She is not currently taking any BP medications.  BP today is 132/88.  She denies chest pain, shortness of breath, palpitations, dizziness, or vision changes.  She reports 1 episode of leg swelling about 1 week ago after consuming a lot of salt and water in her diet that day, and she states it resolved on its own.  There is no ECG on file to be reviewed.  She also complains of anxiety.  She reports difficulty focusing on tasks and racing thoughts, and states these symptoms have increased recently.  She denies panic attacks, crying spells, depression, or SI/HI. She has tried Prozac in the past, but reports it caused depression.  She has also tried Xanax and reports she did not like how it made her feel.  She is interested in trying something else.  She also complains of redness, dryness, and watering of the eyes.  She admits to itchy eyes yesterday but none since then.  She denies sore throat, nasal congestion, or ear fullness.  She reports a history of allergies.  Review of Systems Past Medical History:  Diagnosis Date  . Allergy    Seasonal allergies   . Anxiety   . Hypertension    No complications during pregnancy, diet controlled  . Postpartum care following cesarean delivery (2/15) 02/23/2015   Past Surgical History:  Procedure Laterality Date  . BREAST SURGERY    . CESAREAN SECTION N/A 02/23/2015   Procedure: CESAREAN SECTION;  Surgeon: Servando Salina, MD;  Location: [facility];  Service: Obstetrics;  Laterality: N/A;   Family History  Problem Relation Age of Onset  . Hypertension Mother   . Diabetes Father   .  Anuerysm Maternal Aunt   . Stroke Maternal Grandmother   . Cancer Neg Hx   . Early death Neg Hx    No current outpatient prescriptions on file prior to visit.   No current facility-administered medications on file prior to visit.   No Known Allergies  Const: Denies dizziness. HEENT: Pt reports red, dry, watery eyes. Denies vision changes, nasal congestion, sore throat, or ear fullness. Pulm: Denies shortness of breath. CV: Pt reports 1 episode of leg swelling.  Denies chest pain or palpitations. Psych: Pt reports anxiety.     Objective:   Physical Exam  BP 132/88   Pulse (!) 104   Temp 98.1 F (36.7 C) (Oral)   Ht 5\' 2"  (1.575 m)   Wt 212 lb 8 oz (96.4 kg)   SpO2 99%   BMI 38.87 kg/m   General: Well-appearing, in no acute distress. HEENT: Conjunctival injection present bilaterally.  No scleral icterus or discharge noted.  TMs pearly grey, landmarks visible.  No erythema or discharge of the nose.  No exudates or erythema of pharynx.  Sinuses nontender to palpation. Pulm: Clear to auscultation bilaterally.  No wheezes, rales, or rhonchi. CV: Tachycardic.  S1, S2 auscultated.  No murmurs, rubs, or gallops.  No carotid bruits or peripheral edema noted. Pysch: Normal mood and affect.      Assessment & Plan:  Hypertension:  BP stable off meds Will continue to monitor  Anxiety:  eRx for Zoloft Update me in 4 weeks via mychart  Allergic conjunctivitis:  eRx for Olopatadine drops Can try [facility] if RX too expensive  Call if symptoms worsen or do not resolve  Yahye Siebert, NP

## 2015-09-06 ENCOUNTER — Encounter: Payer: Self-pay | Admitting: Internal Medicine

## 2015-09-29 MED FILL — SERTRALINE HCL 50 MG TABLET: 50 | 30 days supply | Qty: 30 | Fill #1

## 2015-11-14 ENCOUNTER — Other Ambulatory Visit: Payer: Self-pay | Admitting: Internal Medicine

## 2015-11-14 DIAGNOSIS — J302 Other seasonal allergic rhinitis: Secondary | ICD-10-CM

## 2015-11-14 MED ORDER — OLOPATADINE HCL 0.2 % OP SOLN: 1.0000 [drp] | Freq: Every day | OPHTHALMIC | 0 refills | Status: DC

## 2015-11-28 MED FILL — SERTRALINE HCL 50 MG TABLET: 50 | 30 days supply | Qty: 30 | Fill #2

## 2015-12-05 ENCOUNTER — Encounter: Payer: Self-pay | Admitting: Internal Medicine

## 2015-12-05 MED ORDER — SERTRALINE HCL 100 MG PO TABS: 100.0000 mg | ORAL_TABLET | Freq: Every day | ORAL | 3 refills | Status: DC

## 2016-01-12 MED FILL — SERTRALINE HCL 50 MG TABLET: 50 | 30 days supply | Qty: 30 | Fill #3

## 2016-02-06 MED FILL — SERTRALINE HCL 100 MG TAB: 100 | 30 days supply | Qty: 30 | Fill #0

## 2016-03-05 MED FILL — SERTRALINE HCL 100 MG TAB: 100 | 30 days supply | Qty: 30 | Fill #1

## 2016-04-11 ENCOUNTER — Encounter: Payer: Self-pay | Admitting: Internal Medicine

## 2016-04-11 ENCOUNTER — Other Ambulatory Visit: Payer: Self-pay | Admitting: Internal Medicine

## 2016-04-11 MED ORDER — SERTRALINE HCL 100 MG PO TABS: 100.0000 mg | ORAL_TABLET | Freq: Every day | ORAL | 0 refills | Status: DC

## 2016-04-11 MED FILL — SERTRALINE HCL 100 MG TAB: 100 | 90 days supply | Qty: 90 | Fill #0

## 2016-04-17 ENCOUNTER — Ambulatory Visit: Payer: 59 | Admitting: Internal Medicine

## 2016-06-13 ENCOUNTER — Encounter: Payer: Self-pay | Admitting: Internal Medicine

## 2016-06-14 ENCOUNTER — Other Ambulatory Visit: Payer: Self-pay | Admitting: Family Medicine

## 2016-06-14 MED ORDER — SERTRALINE HCL 100 MG PO TABS: 100.0000 mg | ORAL_TABLET | Freq: Every day | ORAL | 0 refills | Status: DC

## 2016-06-22 MED FILL — SERTRALINE HCL 100 MG TAB: 100 | 90 days supply | Qty: 90 | Fill #0

## 2016-08-15 ENCOUNTER — Encounter: Payer: Self-pay | Admitting: Internal Medicine

## 2016-08-16 ENCOUNTER — Ambulatory Visit: Payer: 59 | Admitting: Primary Care

## 2016-08-16 DIAGNOSIS — Z0289 Encounter for other administrative examinations: Secondary | ICD-10-CM

## 2016-09-05 MED FILL — SERTRALINE HCL 100 MG TAB: 100 | 60 days supply | Qty: 60 | Fill #2

## 2016-10-26 ENCOUNTER — Ambulatory Visit: Payer: Self-pay | Admitting: Internal Medicine

## 2016-10-31 ENCOUNTER — Other Ambulatory Visit: Payer: Self-pay | Admitting: Internal Medicine

## 2016-11-01 NOTE — Telephone Encounter (Signed)
Last refill 06/14/16 #90  Last OV  08/23/15  Ok to refill?

## 2016-11-02 MED FILL — SERTRALINE HCL 100 MG TAB: 100 | 30 days supply | Qty: 30 | Fill #0

## 2016-11-22 ENCOUNTER — Encounter: Payer: Self-pay | Admitting: Internal Medicine

## 2016-11-22 ENCOUNTER — Other Ambulatory Visit: Payer: Self-pay | Admitting: Primary Care

## 2016-11-22 DIAGNOSIS — J302 Other seasonal allergic rhinitis: Secondary | ICD-10-CM

## 2016-11-23 MED ORDER — OLOPATADINE HCL 0.2 % OP SOLN: 1.0000 [drp] | Freq: Every day | OPHTHALMIC | 0 refills | Status: DC

## 2016-11-23 NOTE — Telephone Encounter (Signed)
Ok to refill? Electronically refill request for Olopatadine HCl 0.2 % SOLN  Last prescribed by Anda Kraft on 11/14/2015. Last seen on 08/23/2015

## 2016-12-05 ENCOUNTER — Other Ambulatory Visit: Payer: Self-pay | Admitting: Family Medicine

## 2016-12-05 MED FILL — SERTRALINE HCL 100 MG TAB: 100 | 30 days supply | Qty: 30 | Fill #1

## 2016-12-21 ENCOUNTER — Encounter: Payer: Self-pay | Admitting: Internal Medicine

## 2017-01-10 ENCOUNTER — Telehealth: Payer: 59 | Admitting: Physician Assistant

## 2017-01-10 ENCOUNTER — Other Ambulatory Visit: Payer: Self-pay | Admitting: Family Medicine

## 2017-01-10 DIAGNOSIS — J302 Other seasonal allergic rhinitis: Secondary | ICD-10-CM

## 2017-01-10 NOTE — Progress Notes (Signed)
E visit for Allergic Rhinitis We are sorry that you are not feeling well.  Her is how we plan to help!  Based on what you have shared with me it looks like you have Allergic Rhinitis.  Rhinitis is when a reaction occurs that causes nasal congestion, runny nose, sneezing, and itching.  Most types of rhinitis are caused by an inflammation and are associated with symptoms in the eyes ears or throat. There are several types of rhinitis.  The most common are acute rhinitis, which is usually caused by a viral illness, allergic or seasonal rhinitis, and nonallergic or year-round rhinitis.  Nasal allergies occur certain times of the year.  Allergic rhinitis is caused when allergens in the air trigger the release of histamine in the body.  Histamine causes itching, swelling, and fluid to build up in the fragile linings of the nasal passages, sinuses and eyelids.  An itchy nose and clear discharge are common.  I recommend the following over the counter treatments: Xyzal 5 mg take 1 tablet daily  I also would recommend a nasal spray: Flonase 2 sprays into each nostril once daily  HOME CARE:   You can use an over-the-counter saline nasal spray as needed  Avoid areas where there is heavy dust, mites, or molds  Stay indoors on windy days during the pollen season  Keep windows closed in home, at least in bedroom; use air conditioner.  Use high-efficiency house air filter  Keep windows closed in car, turn AC on re-circulate  Avoid playing out with dog during pollen season  GET HELP RIGHT AWAY IF:   If your symptoms do not improve within 10 days  You become short of breath  You develop yellow or green discharge from your nose for over 3 days  You have coughing fits  MAKE SURE YOU:   Understand these instructions  Will watch your condition  Will get help right away if you are not doing well or get worse  Thank you for choosing an e-visit. Your e-visit answers were reviewed by a board  certified advanced clinical practitioner to complete your personal care plan. Depending upon the condition, your plan could have included both over the counter or prescription medications. Please review your pharmacy choice. Be sure that the pharmacy you have chosen is open so that you can pick up your prescription now.  If there is a problem you may message your provider in [place] to have the prescription routed to another pharmacy. Your safety is important to [place]. If you have drug allergies check your prescription carefully.  For the next 24 hours, you can use MyChart to ask questions about today's visit, request a non-urgent call back, or ask for a work or school excuse from your e-visit provider. You will get an email in the next two days asking about your experience. I hope that your e-visit has been valuable and will speed your recovery.

## 2017-01-24 ENCOUNTER — Telehealth: Payer: 59 | Admitting: Family

## 2017-01-24 DIAGNOSIS — J309 Allergic rhinitis, unspecified: Secondary | ICD-10-CM | POA: Diagnosis not present

## 2017-01-24 MED ORDER — FLUTICASONE PROPIONATE 50 MCG/ACT NA SUSP: 2.0000 | Freq: Every day | NASAL | 1 refills | Status: DC

## 2017-01-24 MED ORDER — PREDNISONE 10 MG PO TABS: 10.0000 mg | ORAL_TABLET | Freq: Every day | ORAL | 0 refills | Status: DC

## 2017-01-24 MED FILL — predniSONE 10 MG TABS: 10 | 5 days supply | Qty: 5 | Fill #0

## 2017-01-24 MED FILL — FLUTICASONE PROP 50 MCG SPR: 50 | 30 days supply | Qty: 16 | Fill #0

## 2017-01-24 NOTE — Progress Notes (Signed)
Thank you for the details you included in the comment boxes. Those details are very helpful in determining the best course of treatment for you and help [place] to provide the best care.  E visit for Allergic Rhinitis We are sorry that you are not feeling well.  Her is how we plan to help!  Based on what you have shared with me it looks like you have Allergic Rhinitis.  Rhinitis is when a reaction occurs that causes nasal congestion, runny nose, sneezing, and itching.  Most types of rhinitis are caused by an inflammation and are associated with symptoms in the eyes ears or throat. There are several types of rhinitis.  The most common are acute rhinitis, which is usually caused by a viral illness, allergic or seasonal rhinitis, and nonallergic or year-round rhinitis.  Nasal allergies occur certain times of the year.  Allergic rhinitis is caused when allergens in the air trigger the release of histamine in the body.  Histamine causes itching, swelling, and fluid to build up in the fragile linings of the nasal passages, sinuses and eyelids.  An itchy nose and clear discharge are common.  I recommend the following over the counter treatments: Xyzal 5 mg take 1 tablet daily  I also would recommend a nasal spray: Flonase 2 sprays into each nostril once daily  You may also benefit from eye drops such as: Systane 1-2 driops each eye twice daily as needed (over the counter)  We don't usually give steroids for allergies, but a low-dose can help if they are severe. I am giving 10mg  tabs, please take 1 daily for 5 days.   HOME CARE:   You can use an over-the-counter saline nasal spray as needed  Avoid areas where there is heavy dust, mites, or molds  Stay indoors on windy days during the pollen season  Keep windows closed in home, at least in bedroom; use air conditioner.  Use high-efficiency house air filter  Keep windows closed in car, turn AC on re-circulate  Avoid playing out with dog during  pollen season  GET HELP RIGHT AWAY IF:   If your symptoms do not improve within 10 days  You become short of breath  You develop yellow or green discharge from your nose for over 3 days  You have coughing fits  MAKE SURE YOU:   Understand these instructions  Will watch your condition  Will get help right away if you are not doing well or get worse  Thank you for choosing an e-visit. Your e-visit answers were reviewed by a board certified advanced clinical practitioner to complete your personal care plan. Depending upon the condition, your plan could have included both over the counter or prescription medications. Please review your pharmacy choice. Be sure that the pharmacy you have chosen is open so that you can pick up your prescription now.  If there is a problem you may message your provider in [place] to have the prescription routed to another pharmacy. Your safety is important to [place]. If you have drug allergies check your prescription carefully.  For the next 24 hours, you can use MyChart to ask questions about today's visit, request a non-urgent call back, or ask for a work or school excuse from your e-visit provider. You will get an email in the next two days asking about your experience. I hope that your e-visit has been valuable and will speed your recovery.

## 2017-02-12 MED FILL — SERTRALINE HCL 100 MG TAB: 100 | 30 days supply | Qty: 30 | Fill #2

## 2017-02-22 ENCOUNTER — Ambulatory Visit: Payer: 59 | Admitting: Internal Medicine

## 2017-03-28 ENCOUNTER — Encounter: Payer: 59 | Admitting: Internal Medicine

## 2017-04-09 ENCOUNTER — Other Ambulatory Visit: Payer: Self-pay | Admitting: Family Medicine

## 2017-04-09 NOTE — Telephone Encounter (Signed)
Pt has not been seen since 08/2015

## 2017-05-28 ENCOUNTER — Ambulatory Visit (INDEPENDENT_AMBULATORY_CARE_PROVIDER_SITE_OTHER): Payer: 59 | Admitting: Internal Medicine

## 2017-05-28 ENCOUNTER — Encounter: Payer: Self-pay | Admitting: Internal Medicine

## 2017-05-28 VITALS — BP 148/100 | HR 92 | Temp 98.1°F | Ht 61.5 in | Wt 188.0 lb

## 2017-05-28 DIAGNOSIS — F32A Depression, unspecified: Secondary | ICD-10-CM

## 2017-05-28 DIAGNOSIS — F329 Major depressive disorder, single episode, unspecified: Secondary | ICD-10-CM

## 2017-05-28 DIAGNOSIS — I10 Essential (primary) hypertension: Secondary | ICD-10-CM

## 2017-05-28 DIAGNOSIS — F419 Anxiety disorder, unspecified: Secondary | ICD-10-CM

## 2017-05-28 DIAGNOSIS — Z0000 Encounter for general adult medical examination without abnormal findings: Secondary | ICD-10-CM

## 2017-05-28 LAB — LIPID PANEL
CHOL/HDL RATIO: 2
Cholesterol: 205 mg/dL — ABNORMAL HIGH (ref 0–200)
HDL: 84.3 mg/dL (ref >39.00)
LDL CALC: 98 mg/dL (ref 0–99)
NonHDL: 120.69
TRIGLYCERIDES: 113 mg/dL (ref 0.0–149.0)
VLDL: 22.6 mg/dL (ref 0.0–40.0)

## 2017-05-28 LAB — COMPREHENSIVE METABOLIC PANEL
ALT: 11 U/L (ref 0–35)
AST: 17 U/L (ref 0–37)
Albumin: 4 g/dL (ref 3.5–5.2)
Alkaline Phosphatase: 76 U/L (ref 39–117)
BILIRUBIN TOTAL: 0.9 mg/dL (ref 0.2–1.2)
BUN: 9 mg/dL (ref 6–23)
CALCIUM: 9.1 mg/dL (ref 8.4–10.5)
CHLORIDE: 100 meq/L (ref 96–112)
CO2: 25 meq/L (ref 19–32)
Creatinine, Ser: 0.71 mg/dL (ref 0.40–1.20)
GFR: 116.03 mL/min (ref >60.00)
GLUCOSE: 89 mg/dL (ref 70–99)
POTASSIUM: 3.7 meq/L (ref 3.5–5.1)
Sodium: 135 mEq/L (ref 135–145)
Total Protein: 7.6 g/dL (ref 6.0–8.3)

## 2017-05-28 LAB — CBC
HEMATOCRIT: 41.4 % (ref 36.0–46.0)
HEMOGLOBIN: 14 g/dL (ref 12.0–15.0)
MCHC: 33.7 g/dL (ref 30.0–36.0)
MCV: 93.9 fl (ref 78.0–100.0)
Platelets: 267 10*3/uL (ref 150.0–400.0)
RBC: 4.41 Mil/uL (ref 3.87–5.11)
RDW: 12.4 % (ref 11.5–15.5)
WBC: 3.9 10*3/uL — ABNORMAL LOW (ref 4.0–10.5)

## 2017-05-28 LAB — VITAMIN D 25 HYDROXY (VIT D DEFICIENCY, FRACTURES): VITD: 13.89 ng/mL — ABNORMAL LOW (ref 30.00–100.00)

## 2017-05-28 MED ORDER — FLUOXETINE HCL 10 MG PO CAPS: 10.0000 mg | ORAL_CAPSULE | Freq: Every day | ORAL | 2 refills | Status: DC

## 2017-05-28 MED FILL — FLUoxetine HCL 10 MG CAPS: 10 | 30 days supply | Qty: 30 | Fill #0

## 2017-05-28 NOTE — Patient Instructions (Signed)

## 2017-05-28 NOTE — Progress Notes (Signed)
Subjective:    Patient ID: Rebekah Irwin, female    DOB: 1975/04/02, 42 y.o.   MRN: [id number]  HPI  Pt presents to the clinic today for her annual exam.   Anxiety and Depression: Chronic. She reports she is not currently taking her Zoloft. Because she felt like it wasn't that effective. She reports she has also failed Lexapro in the past. She denies SI/HI.  HTN: Her BP today is 148/100. She denies headaches, dizziness, visual changes, chest pain or shortness of breath. She feels like this is due to recent weight gain. She is not currently taking any antihypertensive medications at this time. There is no ECG on file.  Flu: 10/2016 Tetanus: unsure Pap Smear: 2017, [facility] Mammogram: never Vision Screening; as needed Dentist: annually  Diet: Water Exercise: 6000 steps per day  Review of Systems  Past Medical History:  Diagnosis Date  . Allergy    Seasonal allergies   . Anxiety   . Hypertension    No complications during pregnancy, diet controlled  . Postpartum care following cesarean delivery (2/15) 02/23/2015    Current Outpatient Medications  Medication Sig Dispense Refill  . fluticasone (FLONASE) 50 MCG/ACT nasal spray Place 2 sprays into both nostrils daily. 16 g 1  . Olopatadine HCl 0.2 % SOLN Apply 1 drop daily to eye. 1 Bottle 0  . sertraline (ZOLOFT) 100 MG tablet TAKE 1 TABLET BY MOUTH DAILY. 30 tablet 3   No current facility-administered medications for this visit.     No Known Allergies  Family History  Problem Relation Age of Onset  . Hypertension Mother   . Diabetes Father   . Anuerysm Maternal Aunt   . Stroke Maternal Grandmother   . Cancer Neg Hx   . Early death Neg Hx     Social History   Socioeconomic History  . Marital status: Single    Spouse name: Not on file  . Number of children: 0  . Years of education: 30  . Highest education level: Not on file  Occupational History  . Occupation: Corporate treasurer  Social Needs  .  Financial resource strain: Not on file  . Food insecurity:    Worry: Not on file    Inability: Not on file  . Transportation needs:    Medical: Not on file    Non-medical: Not on file  Tobacco Use  . Smoking status: Former Smoker    Types: Cigarettes  . Smokeless tobacco: Never Used  Substance and Sexual Activity  . Alcohol use: Yes    Alcohol/week: 0.6 oz    Types: 1 Glasses of wine per week  . Drug use: No  . Sexual activity: Yes    Birth control/protection: Condom  Lifestyle  . Physical activity:    Days per week: Not on file    Minutes per session: Not on file  . Stress: Not on file  Relationships  . Social connections:    Talks on phone: Not on file    Gets together: Not on file    Attends religious service: Not on file    Active member of club or organization: Not on file    Attends meetings of clubs or organizations: Not on file    Relationship status: Not on file  . Intimate partner violence:    Fear of current or ex partner: Not on file    Emotionally abused: Not on file    Physically abused: Not on file    Forced  sexual activity: Not on file  Other Topics Concern  . Not on file  Social History Narrative   Regular exercise-no   Caffeine Use-yes     Constitutional: Denies fever, malaise, fatigue, headache or abrupt weight changes.  HEENT: Denies eye pain, eye redness, ear pain, ringing in the ears, wax buildup, runny nose, nasal congestion, bloody nose, or sore throat. Respiratory: Denies difficulty breathing, shortness of breath, cough or sputum production.   Cardiovascular: Denies chest pain, chest tightness, palpitations or swelling in the hands or feet.  Gastrointestinal: Denies abdominal pain, bloating, constipation, diarrhea or blood in the stool.  GU: Denies urgency, frequency, pain with urination, burning sensation, blood in urine, odor or discharge. Musculoskeletal: Denies decrease in range of motion, difficulty with gait, muscle pain or joint pain  and swelling.  Skin: Denies redness, rashes, lesions or ulcercations.  Neurological: Denies dizziness, difficulty with memory, difficulty with speech or problems with balance and coordination.  Psych: Pt reports anxiety and depression. Denies SI/HI.  No other specific complaints in a complete review of systems (except as listed in HPI above).     Objective:   Physical Exam   BP (!) 148/100   Pulse 92   Temp 98.1 F (36.7 C) (Oral)   Ht 5' 1.5" (1.562 m)   Wt 188 lb (85.3 kg)   SpO2 98%   BMI 34.95 kg/m  Wt Readings from Last 3 Encounters:  05/28/17 188 lb (85.3 kg)  08/23/15 212 lb 8 oz (96.4 kg)  03/11/15 219 lb (99.3 kg)    General: Appears her stated age, obese in NAD. Skin: Warm, dry and intact. No rashes, lesions or ulcerations noted. HEENT: Head: normal shape and size; Eyes: sclera white, no icterus, conjunctiva pink, PERRLA and EOMs intact; Ears: Tm's gray and intact, normal light reflex; Throat/Mouth: Teeth present, mucosa pink and moist, no exudate, lesions or ulcerations noted.  Neck:  Neck supple, trachea midline. No masses, lumps or thyromegaly present.  Cardiovascular: Normal rate and rhythm. S1,S2 noted.  No murmur, rubs or gallops noted. No JVD or BLE edema.  Pulmonary/Chest: Normal effort and positive vesicular breath sounds. No respiratory distress. No wheezes, rales or ronchi noted.  Abdomen: Soft and nontender. Normal bowel sounds. No distention or masses noted. Liver, spleen and kidneys non palpable. Musculoskeletal: Strength 5/5 BUE/BLE. No difficulty with gait.  Neurological: Alert and oriented. Cranial nerves II-XII grossly intact. Coordination normal.  Psychiatric: Mood and affect mildly flat. Behavior is normal. Judgment and thought content normal.    BMET    Component Value Date/Time   NA 135 05/28/2017 1008   K 3.7 05/28/2017 1008   CL 100 05/28/2017 1008   CO2 25 05/28/2017 1008   GLUCOSE 89 05/28/2017 1008   BUN 9 05/28/2017 1008    CREATININE 0.71 05/28/2017 1008   CALCIUM 9.1 05/28/2017 1008   GFRNONAA >60 10/22/2014 1530   GFRAA >60 10/22/2014 1530    Lipid Panel     Component Value Date/Time   CHOL 205 (H) 05/28/2017 1008   TRIG 113.0 05/28/2017 1008   HDL 84.30 05/28/2017 1008   CHOLHDL 2 05/28/2017 1008   VLDL 22.6 05/28/2017 1008   LDLCALC 98 05/28/2017 1008    CBC    Component Value Date/Time   WBC 3.9 (L) 05/28/2017 1008   RBC 4.41 05/28/2017 1008   HGB 14.0 05/28/2017 1008   HCT 41.4 05/28/2017 1008   PLT 267.0 05/28/2017 1008   MCV 93.9 05/28/2017 1008   MCH 28.2  02/24/2015 0550   MCHC 33.7 05/28/2017 1008   RDW 12.4 05/28/2017 1008    Hgb A1C Lab Results  Component Value Date   HGBA1C 5.5 12/21/2011           Assessment & Plan:   Preventative Health Maintenance:  Encouraged her to get a flu shot in the fall Will reviewed [facility] records to see if she received Tdap during pregnancy Pap smear UTD She will start screening mammograms at 80 Encouraged her to consume a balanced diet and exercise regimen Advised her to see an eye doctor and dentist annually Will check CBC, CMET, Lipid and Vit D today  RTC in 3 months for follow up HTN Webb Silversmith, NP

## 2017-05-29 ENCOUNTER — Encounter: Payer: Self-pay | Admitting: Internal Medicine

## 2017-05-29 ENCOUNTER — Other Ambulatory Visit: Payer: Self-pay | Admitting: Internal Medicine

## 2017-05-29 DIAGNOSIS — E559 Vitamin D deficiency, unspecified: Secondary | ICD-10-CM

## 2017-05-29 MED ORDER — VITAMIN D (ERGOCALCIFEROL) 1.25 MG (50000 UNIT) PO CAPS: 50000.0000 [IU] | ORAL_CAPSULE | ORAL | 0 refills | Status: DC

## 2017-05-29 MED FILL — VIT D2 1.25 MG (50,000 UNIT: 1.25 MG | 84 days supply | Qty: 12 | Fill #0

## 2017-05-29 NOTE — Assessment & Plan Note (Signed)
Deteriorated off meds Support offered today Will trial Prozac, eRx for 10 mg daliy  Update me in 4 weeks via [employer]

## 2017-05-29 NOTE — Assessment & Plan Note (Signed)
Recheck slightly lower She is hesitant to start antihypertensive therapy at this time Discussed DASH diet and exercise for weight loss

## 2017-06-26 ENCOUNTER — Encounter: Payer: Self-pay | Admitting: Internal Medicine

## 2017-07-03 MED FILL — FLUoxetine HCL 10 MG CAPS: 10 | 30 days supply | Qty: 30 | Fill #1

## 2017-08-22 ENCOUNTER — Encounter: Payer: Self-pay | Admitting: Internal Medicine

## 2017-08-22 ENCOUNTER — Telehealth: Payer: 59 | Admitting: Physician Assistant

## 2017-08-22 DIAGNOSIS — R229 Localized swelling, mass and lump, unspecified: Secondary | ICD-10-CM

## 2017-08-22 NOTE — Progress Notes (Signed)
Based on what you shared with me it looks like you have a condition that should be evaluated in a face to face office visit. What you are describing does not sound consistent with a type of rash we can treat through e visit. This could be a blood blister under the skin or potentially a superficial blood clot, among other diagnosed. Please be seen today for evaluation  NOTE: If you entered your credit card information for this eVisit, you will not be charged. You may see a "hold" on your card for the $30 but that hold will drop off and you will not have a charge processed.  If you are having a true medical emergency please call 911.  If you need an urgent face to face visit, [place] has four urgent care centers for your convenience.  If you need care fast and have a high deductible or no insurance consider:   [email or web address] to reserve your spot online an avoid wait times  [facility] [address], [address] [place], [place] [address] 8 am to 8 pm Monday-Friday 10 am to 4 pm Saturday-Sunday *Across the street from [employer]  [place], [address] 8 am to 5 pm Monday-Friday * In the [facility] on the [facility]   The following sites will take your  insurance:  . [facility]gent [place] a Provider at this Location  [address] [place], [place] [address] . 10 am to 8 pm Monday-Friday . 12 pm to 8 pm Saturday-Sunday   . [facility] at [place] a Provider at this Location  [address], [place], [place] [address] . 8 am to 8 pm Monday-Friday . 9 am to 6 pm Saturday . 11 am to 6 pm Sunday   . [facility] at [place] Get Driving Directions  [address], [place] [address] . 8 am to 8 pm Monday-Friday . 8 am  to 4 pm Saturday-Sunday   Your e-visit answers were reviewed by a board certified advanced clinical practitioner to complete your personal care plan.  Thank you for using e-Visits.

## 2018-01-15 ENCOUNTER — Telehealth: Payer: 59 | Admitting: Nurse Practitioner

## 2018-01-15 DIAGNOSIS — B9789 Other viral agents as the cause of diseases classified elsewhere: Secondary | ICD-10-CM | POA: Diagnosis not present

## 2018-01-15 DIAGNOSIS — J069 Acute upper respiratory infection, unspecified: Secondary | ICD-10-CM

## 2018-01-15 MED ORDER — BENZONATATE 100 MG PO CAPS: 100.0000 mg | ORAL_CAPSULE | Freq: Three times a day (TID) | ORAL | 0 refills | Status: DC | PRN

## 2018-01-15 MED FILL — BENZONATATE 100 MG CAPS: 100 | 7 days supply | Qty: 20 | Fill #0

## 2018-01-15 NOTE — Progress Notes (Signed)

## 2018-04-24 ENCOUNTER — Telehealth: Payer: 59 | Admitting: Family

## 2018-04-24 DIAGNOSIS — J302 Other seasonal allergic rhinitis: Secondary | ICD-10-CM

## 2018-04-24 DIAGNOSIS — J309 Allergic rhinitis, unspecified: Secondary | ICD-10-CM | POA: Diagnosis not present

## 2018-04-24 MED ORDER — FLUTICASONE PROPIONATE 50 MCG/ACT NA SUSP: 2.0000 | Freq: Every day | NASAL | 1 refills | Status: DC

## 2018-04-24 MED ORDER — LEVOCETIRIZINE DIHYDROCHLORIDE 5 MG PO TABS: 5.0000 mg | ORAL_TABLET | Freq: Every evening | ORAL | 0 refills | Status: DC

## 2018-04-24 MED FILL — LEVOCETIRIZINE 5 MG TABLET: 5 | 30 days supply | Qty: 30 | Fill #0

## 2018-04-24 MED FILL — FLUTICASONE PROP 50 MCG SPR: 50 | 30 days supply | Qty: 16 | Fill #0

## 2018-04-24 NOTE — Progress Notes (Signed)
E visit for Allergic Rhinitis We are sorry that you are not feeling well.  Here is how we plan to help!  Based on what you have shared with me it looks like you have Allergic Rhinitis.  Rhinitis is when a reaction occurs that causes nasal congestion, runny nose, sneezing, and itching.  Most types of rhinitis are caused by an inflammation and are associated with symptoms in the eyes ears or throat. There are several types of rhinitis.  The most common are acute rhinitis, which is usually caused by a viral illness, allergic or seasonal rhinitis, and nonallergic or year-round rhinitis.  Nasal allergies occur certain times of the year.  Allergic rhinitis is caused when allergens in the air trigger the release of histamine in the body.  Histamine causes itching, swelling, and fluid to build up in the fragile linings of the nasal passages, sinuses and eyelids.  An itchy nose and clear discharge are common.  I recommend the following over the counter treatments: Xyzal 5 mg take 1 tablet daily  I also would recommend a nasal spray: Flonase 2 sprays into each nostril once daily  I have sent this to your pharmacy  HOME CARE:   You can use an over-the-counter saline nasal spray as needed  Avoid areas where there is heavy dust, mites, or molds  Stay indoors on windy days during the pollen season  Keep windows closed in home, at least in bedroom; use air conditioner.  Use high-efficiency house air filter  Keep windows closed in car, turn AC on re-circulate  Avoid playing out with dog during pollen season  GET HELP RIGHT AWAY IF:   If your symptoms do not improve within 10 days  You become short of breath  You develop yellow or green discharge from your nose for over 3 days  You have coughing fits  MAKE SURE YOU:   Understand these instructions  Will watch your condition  Will get help right away if you are not doing well or get worse  Thank you for choosing an e-visit. Your  e-visit answers were reviewed by a board certified advanced clinical practitioner to complete your personal care plan. Depending upon the condition, your plan could have included both over the counter or prescription medications. Please review your pharmacy choice. Be sure that the pharmacy you have chosen is open so that you can pick up your prescription now.  If there is a problem you may message your provider in [place] to have the prescription routed to another pharmacy. Your safety is important to [place]. If you have drug allergies check your prescription carefully.  For the next 24 hours, you can use MyChart to ask questions about today's visit, request a non-urgent call back, or ask for a work or school excuse from your e-visit provider. You will get an email in the next two days asking about your experience. I hope that your e-visit has been valuable and will speed your recovery.

## 2018-05-08 ENCOUNTER — Encounter: Payer: Self-pay | Admitting: Internal Medicine

## 2018-06-11 ENCOUNTER — Encounter: Payer: 59 | Admitting: Internal Medicine

## 2018-06-27 ENCOUNTER — Encounter: Payer: 59 | Admitting: Internal Medicine

## 2018-06-27 NOTE — Progress Notes (Signed)
Greater than 5 minutes, yet less than 10 minutes of time have been spent researching, coordinating, and implementing care for this patient today.  Thank you for the details you included in the comment boxes. Those details are very helpful in determining the best course of treatment for you and help us to provide the best care.  

## 2018-08-07 ENCOUNTER — Encounter: Payer: 59 | Admitting: Internal Medicine

## 2018-08-07 DIAGNOSIS — Z0289 Encounter for other administrative examinations: Secondary | ICD-10-CM

## 2018-09-04 ENCOUNTER — Other Ambulatory Visit: Payer: Self-pay

## 2018-09-04 DIAGNOSIS — R6889 Other general symptoms and signs: Secondary | ICD-10-CM | POA: Diagnosis not present

## 2018-09-04 DIAGNOSIS — Z20822 Contact with and (suspected) exposure to covid-19: Secondary | ICD-10-CM

## 2018-09-06 LAB — NOVEL CORONAVIRUS, NAA: SARS-CoV-2, NAA: NOT DETECTED

## 2018-10-01 ENCOUNTER — Encounter: Payer: Self-pay | Admitting: Internal Medicine

## 2018-10-02 ENCOUNTER — Encounter: Payer: Self-pay | Admitting: Internal Medicine

## 2018-10-02 ENCOUNTER — Ambulatory Visit (INDEPENDENT_AMBULATORY_CARE_PROVIDER_SITE_OTHER): Payer: 59 | Admitting: Internal Medicine

## 2018-10-02 DIAGNOSIS — F329 Major depressive disorder, single episode, unspecified: Secondary | ICD-10-CM | POA: Diagnosis not present

## 2018-10-02 DIAGNOSIS — F419 Anxiety disorder, unspecified: Secondary | ICD-10-CM | POA: Diagnosis not present

## 2018-10-02 MED ORDER — FLUOXETINE HCL 10 MG PO CAPS: 10.0000 mg | ORAL_CAPSULE | Freq: Every day | ORAL | 2 refills | Status: DC

## 2018-10-02 MED FILL — FLUoxetine HCL 10 MG CAPS: 10 | 30 days supply | Qty: 30 | Fill #0

## 2018-10-02 NOTE — Patient Instructions (Signed)

## 2018-10-02 NOTE — Progress Notes (Signed)
Virtual Visit via Video Note  I connected with Rebekah Irwin on 10/02/18 at  2:00 PM EDT by a video enabled telemedicine application and verified that I am speaking with the correct person using two identifiers.  Location: Patient: Work Provider: Office   I discussed the limitations of evaluation and management by telemedicine and the availability of in person appointments. The patient expressed understanding and agreed to proceed.  History of Present Illness:  Pt wants to follow up anxiety and depression. She has been feeling more anxious over the last year. She discontinued her Prozac about 9 months ago and would like to resume on that medication as it has worked for her previously. She didn't identify any specific triggers but voiced exhaustion with "trying to deal with it all" on her own. Patient states that she's sleeping well. She is not currently seeing a therapist. She denies actual s/s of depression, SI/HI.    Past Medical History:  Diagnosis Date  . Allergy    Seasonal allergies   . Anxiety   . Hypertension    No complications during pregnancy, diet controlled  . Postpartum care following cesarean delivery (2/15) 02/23/2015    Current Outpatient Medications  Medication Sig Dispense Refill  . benzonatate (TESSALON PERLES) 100 MG capsule Take 1 capsule (100 mg total) by mouth 3 (three) times daily as needed for cough. 20 capsule 0  . FLUoxetine (PROZAC) 10 MG capsule Take 1 capsule (10 mg total) by mouth daily. 30 capsule 2  . fluticasone (FLONASE) 50 MCG/ACT nasal spray Place 2 sprays into both nostrils daily. 16 g 1  . levocetirizine (XYZAL) 5 MG tablet Take 1 tablet (5 mg total) by mouth every evening. 30 tablet 0  . Olopatadine HCl 0.2 % SOLN Apply 1 drop daily to eye. 1 Bottle 0  . Vitamin D, Ergocalciferol, (DRISDOL) 50000 units CAPS capsule Take 1 capsule (50,000 Units total) by mouth every 7 (seven) days. 12 capsule 0   No current facility-administered  medications for this visit.     No Known Allergies  Family History  Problem Relation Age of Onset  . Hypertension Mother   . Diabetes Father   . Anuerysm Maternal Aunt   . Stroke Maternal Grandmother   . Cancer Neg Hx   . Early death Neg Hx     Social History   Socioeconomic History  . Marital status: Single    Spouse name: Not on file  . Number of children: 0  . Years of education: 57  . Highest education level: Not on file  Occupational History  . Occupation: Corporate treasurer  Social Needs  . Financial resource strain: Not on file  . Food insecurity    Worry: Not on file    Inability: Not on file  . Transportation needs    Medical: Not on file    Non-medical: Not on file  Tobacco Use  . Smoking status: Former Smoker    Types: Cigarettes  . Smokeless tobacco: Never Used  Substance and Sexual Activity  . Alcohol use: Yes    Alcohol/week: 1.0 standard drinks    Types: 1 Glasses of wine per week  . Drug use: No  . Sexual activity: Yes    Birth control/protection: Condom  Lifestyle  . Physical activity    Days per week: Not on file    Minutes per session: Not on file  . Stress: Not on file  Relationships  . Social Herbalist on phone: Not  on file    Gets together: Not on file    Attends religious service: Not on file    Active member of club or organization: Not on file    Attends meetings of clubs or organizations: Not on file    Relationship status: Not on file  . Intimate partner violence    Fear of current or ex partner: Not on file    Emotionally abused: Not on file    Physically abused: Not on file    Forced sexual activity: Not on file  Other Topics Concern  . Not on file  Social History Narrative   Regular exercise-no   Caffeine Use-yes     Constitutional: Denies fatigue.  Psych: Pt reports anxiety. Denies depression, SI/HI.  No other specific complaints in a complete review of systems (except as listed in HPI  above).  Observations/Objective: There were no vitals taken for this visit. Wt Readings from Last 3 Encounters:  12/05/14 214 lb 12.8 oz (97.4 kg)  10/29/14 207 lb (93.9 kg)  10/22/14 198 lb 9.6 oz (90.1 kg)    General: Appears her stated age, well developed, well nourished in NAD. Psychiatric: Mood and affect normal. Behavior is normal. Judgment and thought content normal.   BMET    Component Value Date/Time   NA 135 05/28/2017 1008   K 3.7 05/28/2017 1008   CL 100 05/28/2017 1008   CO2 25 05/28/2017 1008   GLUCOSE 89 05/28/2017 1008   BUN 9 05/28/2017 1008   CREATININE 0.71 05/28/2017 1008   CALCIUM 9.1 05/28/2017 1008   GFRNONAA >60 10/22/2014 1530   GFRAA >60 10/22/2014 1530    Lipid Panel     Component Value Date/Time   CHOL 205 (H) 05/28/2017 1008   TRIG 113.0 05/28/2017 1008   HDL 84.30 05/28/2017 1008   CHOLHDL 2 05/28/2017 1008   VLDL 22.6 05/28/2017 1008   LDLCALC 98 05/28/2017 1008    CBC    Component Value Date/Time   WBC 3.9 (L) 05/28/2017 1008   RBC 4.41 05/28/2017 1008   HGB 14.0 05/28/2017 1008   HCT 41.4 05/28/2017 1008   PLT 267.0 05/28/2017 1008   MCV 93.9 05/28/2017 1008   MCH 28.2 02/24/2015 0550   MCHC 33.7 05/28/2017 1008   RDW 12.4 05/28/2017 1008    Hgb A1C Lab Results  Component Value Date   HGBA1C 5.5 12/21/2011       Assessment and Plan:  Anxiety and Depression:  Patient will be restarted on  Prozac 10 mg PO daily.  She will follow up in 4 weeks to let us know if it's helped with her symptoms. She was informed of potential side effects.  Patient declines referral for counseling at this time and states that if she does decide to pursue therapy she knows how to access these resources.  Follow Up Instructions:    I discussed the assessment and treatment plan with the patient. The patient was provided an opportunity to ask questions and all were answered. The patient agreed with the plan and demonstrated an understanding  of the instructions.   The patient was advised to call back or seek an in-person evaluation if the symptoms worsen or if the condition fails to improve as anticipated.     Webb Silversmith, NP

## 2018-11-05 MED FILL — FLUoxetine HCL 10 MG CAPS: 10 | 30 days supply | Qty: 30 | Fill #1

## 2018-11-19 ENCOUNTER — Telehealth: Payer: Self-pay

## 2018-11-19 NOTE — Telephone Encounter (Signed)
I have called the pt x 3 and Rebekah Irwin has as well... pt cannot be seen via virtual for leg pain, pt has not been seen in over 1 yr and not note relating to leg pain seen in OV notes per [facility] she has to be seen in office NOT video virtual

## 2018-11-20 ENCOUNTER — Ambulatory Visit: Payer: 59 | Admitting: Internal Medicine

## 2018-11-21 ENCOUNTER — Ambulatory Visit: Payer: 59 | Admitting: Internal Medicine

## 2018-11-21 ENCOUNTER — Encounter: Payer: Self-pay | Admitting: Internal Medicine

## 2018-11-21 ENCOUNTER — Other Ambulatory Visit: Payer: Self-pay

## 2018-11-21 VITALS — BP 136/94 | HR 82 | Temp 97.7°F | Wt 182.0 lb

## 2018-11-21 DIAGNOSIS — R2241 Localized swelling, mass and lump, right lower limb: Secondary | ICD-10-CM

## 2018-11-21 NOTE — Patient Instructions (Signed)
Epidermal Cyst  An epidermal cyst is a small, painless lump under your skin. The cyst contains a grayish-white, bad-smelling substance (keratin). Do not try to pop or open an epidermal cyst yourself. What are the causes?  A blocked hair follicle.  A hair that curls and re-enters the skin instead of growing straight out of the skin.  A blocked pore.  Irritated skin.  An injury to the skin.  Certain conditions that are passed along from parent to child (inherited).  Human papillomavirus (HPV).  Long-term sun damage to the skin. What increases the risk?  Having acne.  Being overweight.  Being 30-40 years old. What are the signs or symptoms? These cysts are usually harmless, but they can get infected. Symptoms of infection may include:  Redness.  Inflammation.  Tenderness.  Warmth.  Fever.  A grayish-white, bad-smelling substance drains from the cyst.  Pus drains from the cyst. How is this treated? In many cases, epidermal cysts go away on their own without treatment. If a cyst becomes infected, treatment may include:  Opening and draining the cyst, done by a doctor. After draining, you may need minor surgery to remove the rest of the cyst.  Antibiotic medicine.  Shots of medicines (steroids) that help to reduce inflammation.  Surgery to remove the cyst. Surgery may be done if the cyst: ? Becomes large. ? Bothers you. ? Has a chance of turning into cancer.  Do not try to open a cyst yourself. Follow these instructions at home:  Take over-the-counter and prescription medicines only as told by your doctor.  If you were prescribed an antibiotic medicine, take it it as told by your doctor. Do not stop using the antibiotic even if you start to feel better.  Keep the area around your cyst clean and dry.  Wear loose, dry clothing.  Avoid touching your cyst.  Check your cyst every day for signs of infection. Check for: ? Redness, swelling, or pain. ? Fluid  or blood. ? Warmth. ? Pus or a bad smell.  Keep all follow-up visits as told by your doctor. This is important. How is this prevented?  Wear clean, dry, clothing.  Avoid wearing tight clothing.  Keep your skin clean and dry. Take showers or baths every day. Contact a doctor if:  Your cyst has symptoms of infection.  Your condition does not improve or gets worse.  You have a cyst that looks different from other cysts you have had.  You have a fever. Get help right away if:  Redness spreads from the cyst into the area close by. Summary  An epidermal cyst is a sac made of skin tissue.  If a cyst becomes infected, treatment may include surgery to open and drain the cyst, or to remove it.  Take over-the-counter and prescription medicines only as told by your doctor.  Contact a doctor if your condition is not improving or is getting worse.  Keep all follow-up visits as told by your doctor. This is important. This information is not intended to replace advice given to you by your health care provider. Make sure you discuss any questions you have with your health care provider. Document Released: 02/02/2004 Document Revised: 04/17/2018 Document Reviewed: 10/03/2017 Elsevier Patient Education  2020 Elsevier Inc.  

## 2018-11-21 NOTE — Progress Notes (Signed)
Subjective:    Patient ID: Rebekah Irwin, female    DOB: 1975-09-26, 43 y.o.   MRN: [id number]  HPI  Pt presents to the clinic today for follow up right leg pain. She reports the pain is sharp in nature and it is mostly on her medial calf of right leg. She mentions a bump in her leg on the medial side behind her calf for about a year. Lately, in last 2 weeks she has had increased pain and leg swelling. She denies any trauma or falls to the legs. She has tried warm compress but no OTC medications. She has not had this in the past. She denies any numbness, discoloration, loss of sensation or tingling in her feet.   Review of Systems      Past Medical History:  Diagnosis Date  . Allergy    Seasonal allergies   . Anxiety   . Hypertension    No complications during pregnancy, diet controlled  . Postpartum care following cesarean delivery (2/15) 02/23/2015    Current Outpatient Medications  Medication Sig Dispense Refill  . FLUoxetine (PROZAC) 10 MG capsule Take 1 capsule (10 mg total) by mouth daily. 30 capsule 2  . fluticasone (FLONASE) 50 MCG/ACT nasal spray Place 2 sprays into both nostrils daily. 16 g 1  . levocetirizine (XYZAL) 5 MG tablet Take 1 tablet (5 mg total) by mouth every evening. 30 tablet 0   No current facility-administered medications for this visit.     No Known Allergies  Family History  Problem Relation Age of Onset  . Hypertension Mother   . Diabetes Father   . Anuerysm Maternal Aunt   . Stroke Maternal Grandmother   . Cancer Neg Hx   . Early death Neg Hx     Social History   Socioeconomic History  . Marital status: Single    Spouse name: Not on file  . Number of children: 0  . Years of education: 28  . Highest education level: Not on file  Occupational History  . Occupation: Corporate treasurer  Social Needs  . Financial resource strain: Not on file  . Food insecurity    Worry: Not on file    Inability: Not on file  . Transportation needs    Medical: Not on file    Non-medical: Not on file  Tobacco Use  . Smoking status: Former Smoker    Types: Cigarettes  . Smokeless tobacco: Never Used  Substance and Sexual Activity  . Alcohol use: Yes    Alcohol/week: 1.0 standard drinks    Types: 1 Glasses of wine per week  . Drug use: No  . Sexual activity: Yes    Birth control/protection: Condom  Lifestyle  . Physical activity    Days per week: Not on file    Minutes per session: Not on file  . Stress: Not on file  Relationships  . Social Herbalist on phone: Not on file    Gets together: Not on file    Attends religious service: Not on file    Active member of club or organization: Not on file    Attends meetings of clubs or organizations: Not on file    Relationship status: Not on file  . Intimate partner violence    Fear of current or ex partner: Not on file    Emotionally abused: Not on file    Physically abused: Not on file    Forced sexual activity: Not on file  Other Topics Concern  . Not on file  Social History Narrative   Regular exercise-no   Caffeine Use-yes     Constitutional: Denies fever, malaise, fatigue, headache or abrupt weight changes.  Respiratory: Denies difficulty breathing, shortness of breath, cough or sputum production.   Cardiovascular: Denies chest pain, chest tightness, palpitations or swelling in the hands or feet.  Musculoskeletal: Pt reports leg pain Denies decrease in range of motion, difficulty with gait, muscle pain or joint pain and swelling.  Skin: Reports mass on medial side of right calf. Denies redness, rashes, or ulcercations.   No other specific complaints in a complete review of systems (except as listed in HPI above).  Objective:   Physical Exam BP (!) 136/94   Pulse 82   Temp 97.7 F (36.5 C) (Temporal)   Wt 182 lb (82.6 kg)   SpO2 98%   BMI 33.83 kg/m   Wt Readings from Last 3 Encounters:  12/05/14 214 lb 12.8 oz (97.4 kg)  10/29/14 207 lb (93.9 kg)   10/22/14 198 lb 9.6 oz (90.1 kg)    General: Appears her stated age, well developed  in NAD. Skin: Tender, mobile, mass noted on medial aspect of right calf measuring approx. 1in in diameter. Warm, dry and intact. No rashes, lesions or ulcerations noted.  Cardiovascular: Slightly tachycardic. Normal rate and rhythm.  Pulmonary/Chest: Normal effort and positive vesicular breath sounds. No respiratory distress. No wheezes, rales or ronchi noted.  Musculoskeletal: Normal range of motion. 5+ strength to bilateral upper and lower extremities.  No difficulty with gait.  Neurological: Sensation intact to bilateral upper/lower extremities. Alert and oriented. Coordination normal.    BMET    Component Value Date/Time   NA 135 05/28/2017 1008   K 3.7 05/28/2017 1008   CL 100 05/28/2017 1008   CO2 25 05/28/2017 1008   GLUCOSE 89 05/28/2017 1008   BUN 9 05/28/2017 1008   CREATININE 0.71 05/28/2017 1008   CALCIUM 9.1 05/28/2017 1008   GFRNONAA >60 10/22/2014 1530   GFRAA >60 10/22/2014 1530    Lipid Panel     Component Value Date/Time   CHOL 205 (H) 05/28/2017 1008   TRIG 113.0 05/28/2017 1008   HDL 84.30 05/28/2017 1008   CHOLHDL 2 05/28/2017 1008   VLDL 22.6 05/28/2017 1008   LDLCALC 98 05/28/2017 1008    CBC    Component Value Date/Time   WBC 3.9 (L) 05/28/2017 1008   RBC 4.41 05/28/2017 1008   HGB 14.0 05/28/2017 1008   HCT 41.4 05/28/2017 1008   PLT 267.0 05/28/2017 1008   MCV 93.9 05/28/2017 1008   MCH 28.2 02/24/2015 0550   MCHC 33.7 05/28/2017 1008   RDW 12.4 05/28/2017 1008    Hgb A1C Lab Results  Component Value Date   HGBA1C 5.5 12/21/2011            Assessment & Plan:  Mass of RLE:  Ordered u/s soft tissue of right lower leg May refer to general surgery pend ultrasound Patient reports she does not want to try pain meds at this time Advised pt to use peppermint oil to affected area to help wit pain Continue with OTC symptomatic treatment   Will  follow up after imaging results are back.  Webb Silversmith, NP

## 2018-12-03 ENCOUNTER — Other Ambulatory Visit: Payer: Self-pay | Admitting: Internal Medicine

## 2018-12-03 ENCOUNTER — Ambulatory Visit
Admission: RE | Admit: 2018-12-03 | Discharge: 2018-12-03 | Disposition: A | Discharge status: Home / Self Care | Payer: 59 | Source: Ambulatory Visit | Attending: Internal Medicine | Admitting: Internal Medicine

## 2018-12-03 DIAGNOSIS — R2241 Localized swelling, mass and lump, right lower limb: Secondary | ICD-10-CM

## 2018-12-09 MED FILL — FLUoxetine HCL 10 MG CAPS: 10 | 30 days supply | Qty: 30 | Fill #2

## 2018-12-10 ENCOUNTER — Other Ambulatory Visit: Payer: Self-pay | Admitting: Internal Medicine

## 2018-12-10 ENCOUNTER — Encounter: Payer: Self-pay | Admitting: Internal Medicine

## 2018-12-10 ENCOUNTER — Ambulatory Visit
Admission: RE | Admit: 2018-12-10 | Discharge: 2018-12-10 | Disposition: A | Discharge status: Home / Self Care | Payer: 59 | Source: Ambulatory Visit | Attending: Internal Medicine | Admitting: Internal Medicine

## 2018-12-10 DIAGNOSIS — R2241 Localized swelling, mass and lump, right lower limb: Secondary | ICD-10-CM

## 2018-12-10 NOTE — Telephone Encounter (Signed)
Pt would like regina to call her today regarding her MRI results  Best number [phone] and ask for Keirstyn  Pt is very anxious about results

## 2018-12-12 ENCOUNTER — Telehealth: Payer: Self-pay | Admitting: Internal Medicine

## 2018-12-12 NOTE — Telephone Encounter (Signed)
Paperwork faxed °

## 2018-12-12 NOTE — Telephone Encounter (Signed)
fmla paperwork in regina's in box °

## 2018-12-12 NOTE — Telephone Encounter (Signed)
Done, given back to Rebekah Irwin 

## 2018-12-12 NOTE — Telephone Encounter (Signed)
Spoke to pt she wanted her fmla to start 11/25  Intermittent

## 2018-12-15 ENCOUNTER — Encounter: Payer: Self-pay | Admitting: Internal Medicine

## 2018-12-16 ENCOUNTER — Encounter: Payer: Self-pay | Admitting: Internal Medicine

## 2018-12-16 ENCOUNTER — Other Ambulatory Visit: Payer: Self-pay | Admitting: Surgery

## 2018-12-16 DIAGNOSIS — R2241 Localized swelling, mass and lump, right lower limb: Secondary | ICD-10-CM | POA: Diagnosis not present

## 2018-12-16 NOTE — Telephone Encounter (Signed)
Paperwork faxed °

## 2018-12-22 ENCOUNTER — Inpatient Hospital Stay (HOSPITAL_COMMUNITY): Admission: RE | Admit: 2018-12-22 | Payer: 59 | Source: Ambulatory Visit

## 2018-12-22 NOTE — Telephone Encounter (Signed)
Copy for pt Copy for scan Sent message through my chart letting pt know

## 2018-12-23 NOTE — Progress Notes (Signed)
[place], [place] - [address] [facility] [address] [place] [address] Phone: [phone] Fax: [phone]     Your procedure is scheduled on Tuesday, December 29th, 2020.   Report to [facility] Main Entrance "A" at 11:15 A.M., and check in at the Admitting office.   Call this number if you have problems the morning of surgery:  [phone]  Call [phone] if you have any questions prior to your surgery date Monday-Friday 8am-4pm    Remember:  Do not eat or drink after midnight the night before your surgery    Take these medicines the morning of surgery with A SIP OF WATER :  Fluoxetine (Prozac)  7 days prior to surgery STOP taking any Aspirin (unless otherwise instructed by your surgeon), Aleve, Naproxen, Ibuprofen, Motrin, Advil, Goody's, BC's, all herbal medications, fish oil, and all vitamins.    The Morning of Surgery  Do not wear jewelry, make-up or nail polish.  Do not wear lotions, powders, or perfumes/colognes, or deodorant  Do not shave 48 hours prior to surgery.  Men may shave face and neck.  Do not bring valuables to the hospital.  [facility] is not responsible for any belongings or valuables.  If you are a smoker, DO NOT Smoke 24 hours prior to surgery  If you wear a CPAP at night please bring your mask, tubing, and machine the morning of surgery   Remember that you must have someone to transport you home after your surgery, and remain with you for 24 hours if you are discharged the same day.   Please bring cases for contacts, glasses, hearing aids, dentures or bridgework because it cannot be worn into surgery.    Leave your suitcase in the car.  After surgery it may be brought to your room.  For patients admitted to the hospital, discharge time will be determined by your treatment team.  Patients discharged the day of surgery will not be allowed to drive home.    Special instructions:   [facility]- Preparing For Surgery  Before surgery, you can play an important role. Because skin is not sterile, your skin needs to be as free of germs as possible. You can reduce the number of germs on your skin by washing with CHG (chlorahexidine gluconate) Soap before surgery.  CHG is an antiseptic cleaner which kills germs and bonds with the skin to continue killing germs even after washing.    Oral Hygiene is also important to reduce your risk of infection.  Remember - BRUSH YOUR TEETH THE MORNING OF SURGERY WITH YOUR REGULAR TOOTHPASTE  Please do not use if you have an allergy to CHG or antibacterial soaps. If your skin becomes reddened/irritated stop using the CHG.  Do not shave (including legs and underarms) for at least 48 hours prior to first CHG shower. It is OK to shave your face.  Please follow these instructions carefully.   1. Shower the NIGHT BEFORE SURGERY and the MORNING OF SURGERY with CHG Soap.   2. If you chose to wash your hair, wash your hair first as usual with your normal shampoo.  3. After you shampoo, rinse your hair and body thoroughly to remove the shampoo.  4. Use CHG as you would any other liquid soap. You can apply CHG directly to the skin and wash gently with a scrungie or a clean washcloth.   5. Apply the CHG Soap to your body ONLY FROM THE NECK  DOWN.  Do not use on open wounds or open sores. Avoid contact with your eyes, ears, mouth and genitals (private parts). Wash Face and genitals (private parts)  with your normal soap.   6. Wash thoroughly, paying special attention to the area where your surgery will be performed.  7. Thoroughly rinse your body with warm water from the neck down.  8. DO NOT shower/wash with your normal soap after using and rinsing off the CHG Soap.  9. Pat yourself dry with a CLEAN TOWEL.  10. Wear CLEAN PAJAMAS to bed the night before surgery, wear comfortable clothes the morning of surgery  11. Place CLEAN SHEETS on your bed the  night of your first shower and DO NOT SLEEP WITH PETS.    Day of Surgery:  Please shower the morning of surgery with the CHG soap Do not apply any deodorants/lotions. Please wear clean clothes to the hospital/surgery center.   Remember to brush your teeth WITH YOUR REGULAR TOOTHPASTE.   Please read over the following fact sheets that you were given.

## 2018-12-24 ENCOUNTER — Other Ambulatory Visit (HOSPITAL_COMMUNITY): Payer: 59

## 2018-12-24 ENCOUNTER — Inpatient Hospital Stay (HOSPITAL_COMMUNITY)
Admission: RE | Admit: 2018-12-24 | Discharge: 2018-12-24 | Disposition: A | Discharge status: Home / Self Care | Payer: 59 | Source: Ambulatory Visit

## 2018-12-25 ENCOUNTER — Encounter (HOSPITAL_BASED_OUTPATIENT_CLINIC_OR_DEPARTMENT_OTHER): Payer: Self-pay | Admitting: Surgery

## 2018-12-25 ENCOUNTER — Other Ambulatory Visit: Payer: Self-pay

## 2018-12-25 ENCOUNTER — Other Ambulatory Visit: Payer: Self-pay | Admitting: Internal Medicine

## 2018-12-26 MED FILL — FLUoxetine HCL 10 MG CAPS: 10 | 30 days supply | Qty: 30 | Fill #0

## 2018-12-26 NOTE — Telephone Encounter (Signed)
Rebekah Irwin at [facility] left message on triage line following up on this refill request. [place] and [place] requesting approval for this to be filled now. This would be early. Rebekah Irwin asked for call back at [phone].

## 2018-12-26 NOTE — Telephone Encounter (Signed)
Ok for early fill

## 2019-01-03 ENCOUNTER — Other Ambulatory Visit (HOSPITAL_COMMUNITY)
Admission: RE | Admit: 2019-01-03 | Discharge: 2019-01-03 | Disposition: A | Discharge status: Home / Self Care | Payer: 59 | Source: Ambulatory Visit | Attending: Surgery | Admitting: Surgery

## 2019-01-03 ENCOUNTER — Other Ambulatory Visit (HOSPITAL_COMMUNITY): Payer: Self-pay

## 2019-01-03 DIAGNOSIS — Z01812 Encounter for preprocedural laboratory examination: Secondary | ICD-10-CM | POA: Insufficient documentation

## 2019-01-03 DIAGNOSIS — Z20828 Contact with and (suspected) exposure to other viral communicable diseases: Secondary | ICD-10-CM | POA: Insufficient documentation

## 2019-01-03 LAB — SARS CORONAVIRUS 2 (TAT 6-24 HRS): SARS Coronavirus 2: NEGATIVE

## 2019-01-05 NOTE — H&P (Signed)
Genella Rife Documented: 12/16/2018 9:57 AM Location: [facility] Patient #: [id number] DOB: 14-Dec-1975 Single / Language: Enedina Finner / Race: Refused to Report/Unreported Female   History of Present Illness (Mumin Denomme A. Ninfa Linden MD; 12/16/2018 10:08 AM) The patient is a 43 year old female who presents with a complaint of Mass. 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 She is referred by Webb Silversmith NP. She reports she has had the mass for about a year but is now getting larger and causing pain. The pain is mostly with ambulation. She underwent a ultrasound showing the adenomatous tissue centimeter mass which was not consistent with a lipoma or cyst. It had internal vascularity. She then underwent an MRI of the lower leg. This showed the mass to measure almost 2 cm and was consistent with a neoplasm. It was involving the simultaneous fat of the right calf. Complete excisional biopsy is been strongly recommended by radiology. The patient has no previous history of malignancy. She is otherwise healthy without complaints.   Past Surgical History Sallyanne Kuster, [place]; 12/16/2018 9:57 AM) Cesarean Section - 1   Diagnostic Studies History Sallyanne Kuster, [place]; 12/16/2018 9:57 AM) Colonoscopy  never Mammogram  never Pap Smear  1-5 years ago  Allergies Sallyanne Kuster, [place]; 12/16/2018 9:58 AM) No Known Drug Allergies [12/16/2018]: Allergies Reconciled   Medication History Sallyanne Kuster, CMA; 12/16/2018 9:58 AM) FLUoxetine HCl (10MG  Capsule, Oral) Active. Medications Reconciled  Social History Sallyanne Kuster, [place]; 12/16/2018 9:57 AM) Alcohol use  Occasional alcohol use. Caffeine use  Carbonated beverages. No drug use  Tobacco use  Former smoker.  Family History Sallyanne Kuster, [place]; 12/16/2018 9:57 AM) Diabetes Mellitus  Father. Hypertension  Mother.  Pregnancy / Birth History Sallyanne Kuster, [place]; 12/16/2018  9:57 AM) Age at menarche  43 years. Contraceptive History  Contraceptive implant. Gravida  1 Irregular periods  Length (months) of breastfeeding  3-6 Maternal age  96-30 Para  1  Other Problems Sallyanne Kuster, [place]; 12/16/2018 9:57 AM) High blood pressure     Review of Systems Sallyanne Kuster CMA; 12/16/2018 9:57 AM) General Not Present- Appetite Loss, Chills, Fatigue, Fever, Night Sweats, Weight Gain and Weight Loss. HEENT Not Present- Earache, Hearing Loss, Hoarseness, Nose Bleed, Oral Ulcers, Ringing in the Ears, Seasonal Allergies, Sinus Pain, Sore Throat, Visual Disturbances, Wears glasses/contact lenses and Yellow Eyes. Respiratory Not Present- Bloody sputum, Chronic Cough, Difficulty Breathing, Snoring and Wheezing. Breast Not Present- Breast Mass, Breast Pain, Nipple Discharge and Skin Changes. Cardiovascular Not Present- Chest Pain, Difficulty Breathing Lying Down, Leg Cramps, Palpitations, Rapid Heart Rate, Shortness of Breath and Swelling of Extremities. Gastrointestinal Not Present- Abdominal Pain, Bloating, Bloody Stool, Change in Bowel Habits, Chronic diarrhea, Constipation, Difficulty Swallowing, Excessive gas, Gets full quickly at meals, Hemorrhoids, Indigestion, Nausea, Rectal Pain and Vomiting. Female Genitourinary Not Present- Frequency, Nocturia, Painful Urination, Pelvic Pain and Urgency. Musculoskeletal Not Present- Back Pain, Joint Pain, Joint Stiffness, Muscle Pain, Muscle Weakness and Swelling of Extremities. Neurological Not Present- Decreased Memory, Fainting, Headaches, Numbness, Seizures, Tingling, Tremor, Trouble walking and Weakness. Psychiatric Present- Anxiety. Not Present- Bipolar, Change in Sleep Pattern, Depression, Fearful and Frequent crying. Endocrine Not Present- Cold Intolerance, Excessive Hunger, Hair Changes, Heat Intolerance, Hot flashes and New Diabetes. Hematology Not Present- Blood Thinners, Easy Bruising, Excessive bleeding, Gland problems, HIV  and Persistent Infections.  Vitals Sallyanne Kuster CMA; 12/16/2018 9:58 AM) 12/16/2018 9:58 AM Weight: 179 lb Height: 62in Body Surface Area:  1.82 m Body Mass Index: 32.74 kg/m  Temp.: 98.58F  Pulse: 107 (Regular)  BP: 160/105 (Sitting, Left Arm, Standard)       Physical Exam (Markeia Harkless A. Ninfa Linden MD; 12/16/2018 10:09 AM) The physical exam findings are as follows: Note:She appears well exam.  On her right medial/posterior calf, there is a 2 cm soft mass in the subcutaneous tissue. There are no skin changes and it is nonpulsatile. It is tender. Lungs clear CV RRR Abdomen soft, NT/ND   Assessment & Plan (Topanga Alvelo A. Ninfa Linden MD; 12/16/2018 10:09 AM) LOWER LEG MASS, RIGHT (R22.41) Impression: I have reviewed her MRI and ultrasound of the right lower leg. This is an almost 2 cm leg mass of uncertain etiology. Findings are worrisome for a neoplasm like a desmoid tumor or sarcoma. Complete surgical excision strongly recommended and because of her symptoms should be done as soon as possible for histologic evaluation. I discussed the procedure with her in detail. I discussed the risks of bleeding, infection, the need for further procedures of malignancy is found, cardiopulmonary issues, postoperative recovery, etc. She understands and wishes to proceed with surgery as soon as possible.

## 2019-01-06 ENCOUNTER — Other Ambulatory Visit: Payer: Self-pay

## 2019-01-06 ENCOUNTER — Encounter (HOSPITAL_BASED_OUTPATIENT_CLINIC_OR_DEPARTMENT_OTHER)
Admission: RE | Disposition: A | Discharge status: Home / Self Care | Payer: Self-pay | Source: Home / Self Care | Attending: Surgery

## 2019-01-06 ENCOUNTER — Ambulatory Visit (HOSPITAL_BASED_OUTPATIENT_CLINIC_OR_DEPARTMENT_OTHER): Payer: 59 | Admitting: Anesthesiology

## 2019-01-06 ENCOUNTER — Ambulatory Visit (HOSPITAL_BASED_OUTPATIENT_CLINIC_OR_DEPARTMENT_OTHER)
Admission: RE | Admit: 2019-01-06 | Discharge: 2019-01-06 | Disposition: A | Discharge status: Home / Self Care | Payer: 59 | Attending: Surgery | Admitting: Surgery

## 2019-01-06 ENCOUNTER — Encounter (HOSPITAL_BASED_OUTPATIENT_CLINIC_OR_DEPARTMENT_OTHER): Payer: Self-pay | Admitting: Surgery

## 2019-01-06 DIAGNOSIS — F419 Anxiety disorder, unspecified: Secondary | ICD-10-CM | POA: Insufficient documentation

## 2019-01-06 DIAGNOSIS — Z87891 Personal history of nicotine dependence: Secondary | ICD-10-CM | POA: Insufficient documentation

## 2019-01-06 DIAGNOSIS — R2241 Localized swelling, mass and lump, right lower limb: Secondary | ICD-10-CM | POA: Diagnosis not present

## 2019-01-06 DIAGNOSIS — D2371 Other benign neoplasm of skin of right lower limb, including hip: Secondary | ICD-10-CM | POA: Insufficient documentation

## 2019-01-06 DIAGNOSIS — F418 Other specified anxiety disorders: Secondary | ICD-10-CM | POA: Diagnosis not present

## 2019-01-06 DIAGNOSIS — J302 Other seasonal allergic rhinitis: Secondary | ICD-10-CM | POA: Diagnosis not present

## 2019-01-06 DIAGNOSIS — F329 Major depressive disorder, single episode, unspecified: Secondary | ICD-10-CM | POA: Diagnosis not present

## 2019-01-06 DIAGNOSIS — Z6832 Body mass index (BMI) 32.0-32.9, adult: Secondary | ICD-10-CM | POA: Insufficient documentation

## 2019-01-06 DIAGNOSIS — E669 Obesity, unspecified: Secondary | ICD-10-CM | POA: Diagnosis not present

## 2019-01-06 DIAGNOSIS — I10 Essential (primary) hypertension: Secondary | ICD-10-CM | POA: Insufficient documentation

## 2019-01-06 DIAGNOSIS — D2121 Benign neoplasm of connective and other soft tissue of right lower limb, including hip: Secondary | ICD-10-CM | POA: Diagnosis not present

## 2019-01-06 HISTORY — PX: MASS EXCISION: SHX2000

## 2019-01-06 LAB — POCT PREGNANCY, URINE: Preg Test, Ur: NEGATIVE

## 2019-01-06 SURGERY — EXCISION MASS
Anesthesia: General | Site: Leg Lower | Laterality: Right

## 2019-01-06 MED ORDER — ACETAMINOPHEN 500 MG PO TABS
ORAL_TABLET | ORAL | Status: AC
  Filled 2019-01-06: qty 1

## 2019-01-06 MED ORDER — OXYCODONE HCL 5 MG PO TABS: 5.0000 mg | ORAL_TABLET | Freq: Four times a day (QID) | ORAL | 0 refills | Status: DC | PRN

## 2019-01-06 MED ORDER — ACETAMINOPHEN 500 MG PO TABS
1000.0000 mg | ORAL_TABLET | ORAL | Status: AC
  Administered 2019-01-06: 08:00:00 1000 mg via ORAL

## 2019-01-06 MED ORDER — FENTANYL CITRATE (PF) 100 MCG/2ML IJ SOLN
INTRAMUSCULAR | Status: AC
  Filled 2019-01-06: qty 2

## 2019-01-06 MED ORDER — PROPOFOL 10 MG/ML IV BOLUS
INTRAVENOUS | Status: AC
  Filled 2019-01-06: qty 20

## 2019-01-06 MED ORDER — BUPIVACAINE-EPINEPHRINE 0.5% -1:200000 IJ SOLN
INTRAMUSCULAR | Status: DC | PRN
  Administered 2019-01-06: 15 mL

## 2019-01-06 MED ORDER — MIDAZOLAM HCL 2 MG/2ML IJ SOLN
INTRAMUSCULAR | Status: AC
  Filled 2019-01-06: qty 2

## 2019-01-06 MED ORDER — DEXAMETHASONE SODIUM PHOSPHATE 10 MG/ML IJ SOLN
INTRAMUSCULAR | Status: DC | PRN
  Administered 2019-01-06: 4 mg via INTRAVENOUS

## 2019-01-06 MED ORDER — CELECOXIB 200 MG PO CAPS
200.0000 mg | ORAL_CAPSULE | ORAL | Status: AC
  Administered 2019-01-06: 08:00:00 200 mg via ORAL

## 2019-01-06 MED ORDER — CHLORHEXIDINE GLUCONATE CLOTH 2 % EX PADS: 6.0000 | MEDICATED_PAD | Freq: Once | CUTANEOUS | Status: DC

## 2019-01-06 MED ORDER — LIDOCAINE 2% (20 MG/ML) 5 ML SYRINGE
INTRAMUSCULAR | Status: DC | PRN
  Administered 2019-01-06: 40 mg via INTRAVENOUS
  Administered 2019-01-06: 60 mg via INTRAVENOUS

## 2019-01-06 MED ORDER — DEXAMETHASONE SODIUM PHOSPHATE 10 MG/ML IJ SOLN
INTRAMUSCULAR | Status: AC
  Filled 2019-01-06: qty 1

## 2019-01-06 MED ORDER — GABAPENTIN 300 MG PO CAPS
ORAL_CAPSULE | ORAL | Status: AC
  Filled 2019-01-06: qty 1

## 2019-01-06 MED ORDER — PROMETHAZINE HCL 25 MG/ML IJ SOLN: 6.2500 mg | INTRAMUSCULAR | Status: DC | PRN

## 2019-01-06 MED ORDER — CELECOXIB 200 MG PO CAPS
ORAL_CAPSULE | ORAL | Status: AC
  Filled 2019-01-06: qty 1

## 2019-01-06 MED ORDER — CEFAZOLIN SODIUM-DEXTROSE 2-4 GM/100ML-% IV SOLN
2.0000 g | INTRAVENOUS | Status: AC
  Administered 2019-01-06: 2 g via INTRAVENOUS

## 2019-01-06 MED ORDER — OXYCODONE HCL 5 MG PO TABS: 5.0000 mg | ORAL_TABLET | Freq: Once | ORAL | Status: DC | PRN

## 2019-01-06 MED ORDER — HYDROMORPHONE HCL 1 MG/ML IJ SOLN: 0.2500 mg | INTRAMUSCULAR | Status: DC | PRN

## 2019-01-06 MED ORDER — FENTANYL CITRATE (PF) 100 MCG/2ML IJ SOLN
INTRAMUSCULAR | Status: DC | PRN
  Administered 2019-01-06: 25 ug via INTRAVENOUS
  Administered 2019-01-06: 50 ug via INTRAVENOUS
  Administered 2019-01-06: 25 ug via INTRAVENOUS

## 2019-01-06 MED ORDER — MEPERIDINE HCL 25 MG/ML IJ SOLN: 6.2500 mg | INTRAMUSCULAR | Status: DC | PRN

## 2019-01-06 MED ORDER — PHENYLEPHRINE 40 MCG/ML (10ML) SYRINGE FOR IV PUSH (FOR BLOOD PRESSURE SUPPORT)
PREFILLED_SYRINGE | INTRAVENOUS | Status: AC
  Filled 2019-01-06: qty 10

## 2019-01-06 MED ORDER — ROCURONIUM BROMIDE 10 MG/ML (PF) SYRINGE
PREFILLED_SYRINGE | INTRAVENOUS | Status: AC
  Filled 2019-01-06: qty 10

## 2019-01-06 MED ORDER — CEFAZOLIN SODIUM-DEXTROSE 2-4 GM/100ML-% IV SOLN
INTRAVENOUS | Status: AC
  Filled 2019-01-06: qty 100

## 2019-01-06 MED ORDER — GABAPENTIN 300 MG PO CAPS
300.0000 mg | ORAL_CAPSULE | ORAL | Status: AC
  Administered 2019-01-06: 08:00:00 300 mg via ORAL

## 2019-01-06 MED ORDER — OXYCODONE HCL 5 MG/5ML PO SOLN: 5.0000 mg | Freq: Once | ORAL | Status: DC | PRN

## 2019-01-06 MED ORDER — LACTATED RINGERS IV SOLN: INTRAVENOUS | Status: DC

## 2019-01-06 MED ORDER — ONDANSETRON HCL 4 MG/2ML IJ SOLN
INTRAMUSCULAR | Status: AC
  Filled 2019-01-06: qty 2

## 2019-01-06 MED ORDER — LIDOCAINE 2% (20 MG/ML) 5 ML SYRINGE
INTRAMUSCULAR | Status: AC
  Filled 2019-01-06: qty 5

## 2019-01-06 MED ORDER — PROPOFOL 10 MG/ML IV BOLUS
INTRAVENOUS | Status: DC | PRN
  Administered 2019-01-06: 70 mg via INTRAVENOUS
  Administered 2019-01-06: 200 mg via INTRAVENOUS

## 2019-01-06 MED ORDER — MIDAZOLAM HCL 5 MG/5ML IJ SOLN
INTRAMUSCULAR | Status: DC | PRN
  Administered 2019-01-06: 2 mg via INTRAVENOUS

## 2019-01-06 MED FILL — oxyCODONE HCL 5 MG TABS: 5 | 6 days supply | Qty: 25 | Fill #0

## 2019-01-06 SURGICAL SUPPLY — 43 items
ADH SKN CLS APL DERMABOND .7 (GAUZE/BANDAGES/DRESSINGS) ×1
APL PRP STRL LF DISP 70% ISPRP (MISCELLANEOUS) ×1
BLADE CLIPPER SURG (BLADE) IMPLANT
BLADE HEX COATED 2.75 (ELECTRODE) ×3 IMPLANT
BLADE SURG 15 STRL LF DISP TIS (BLADE) ×1 IMPLANT
BLADE SURG 15 STRL SS (BLADE) ×3
BNDG CONFORM 4 STRL LF (GAUZE/BANDAGES/DRESSINGS) IMPLANT
BNDG ELASTIC 4X5.8 VLCR STR LF (GAUZE/BANDAGES/DRESSINGS) IMPLANT
CANISTER SUCT 1200ML W/VALVE (MISCELLANEOUS) ×3 IMPLANT
CHLORAPREP W/TINT 26 (MISCELLANEOUS) ×3 IMPLANT
COVER BACK TABLE REUSABLE LG (DRAPES) ×3 IMPLANT
COVER MAYO STAND REUSABLE (DRAPES) ×3 IMPLANT
COVER WAND RF STERILE (DRAPES) IMPLANT
DECANTER SPIKE VIAL GLASS SM (MISCELLANEOUS) ×3 IMPLANT
DERMABOND ADVANCED (GAUZE/BANDAGES/DRESSINGS) ×2
DERMABOND ADVANCED .7 DNX12 (GAUZE/BANDAGES/DRESSINGS) ×1 IMPLANT
DRAPE LAPAROTOMY 100X72 PEDS (DRAPES) ×3 IMPLANT
DRAPE UTILITY XL STRL (DRAPES) ×3 IMPLANT
ELECT REM PT RETURN 9FT ADLT (ELECTROSURGICAL) ×3
ELECTRODE REM PT RTRN 9FT ADLT (ELECTROSURGICAL) ×1 IMPLANT
GAUZE SPONGE 4X4 12PLY STRL LF (GAUZE/BANDAGES/DRESSINGS) ×3 IMPLANT
GLOVE SURG SIGNA 7.5 PF LTX (GLOVE) ×3 IMPLANT
GOWN STRL REUS W/ TWL LRG LVL3 (GOWN DISPOSABLE) ×1 IMPLANT
GOWN STRL REUS W/ TWL XL LVL3 (GOWN DISPOSABLE) ×1 IMPLANT
GOWN STRL REUS W/TWL LRG LVL3 (GOWN DISPOSABLE) ×3
GOWN STRL REUS W/TWL XL LVL3 (GOWN DISPOSABLE) ×3
NEEDLE HYPO 25X1 1.5 SAFETY (NEEDLE) ×3 IMPLANT
NS IRRIG 1000ML POUR BTL (IV SOLUTION) ×3 IMPLANT
PACK BASIN DAY SURGERY FS (CUSTOM PROCEDURE TRAY) ×3 IMPLANT
PENCIL SMOKE EVACUATOR (MISCELLANEOUS) ×3 IMPLANT
SLEEVE SCD COMPRESS KNEE MED (MISCELLANEOUS) ×3 IMPLANT
SPONGE LAP 4X18 RFD (DISPOSABLE) ×3 IMPLANT
SUT MNCRL AB 4-0 PS2 18 (SUTURE) ×3 IMPLANT
SUT VIC AB 2-0 SH 27 (SUTURE)
SUT VIC AB 2-0 SH 27XBRD (SUTURE) IMPLANT
SUT VIC AB 3-0 SH 27 (SUTURE) ×3
SUT VIC AB 3-0 SH 27X BRD (SUTURE) ×1 IMPLANT
SYR BULB 3OZ (MISCELLANEOUS) ×3 IMPLANT
SYR CONTROL 10ML LL (SYRINGE) ×3 IMPLANT
TOWEL GREEN STERILE FF (TOWEL DISPOSABLE) ×3 IMPLANT
TUBE CONNECTING 20'X1/4 (TUBING) ×1
TUBE CONNECTING 20X1/4 (TUBING) ×2 IMPLANT
YANKAUER SUCT BULB TIP NO VENT (SUCTIONS) ×3 IMPLANT

## 2019-01-06 NOTE — Discharge Instructions (Signed)
No vigorous activity for 1 week.  Try to limit ambulation for the next several days.  Elevate the leg for swelling  Shower starting tomorrow  May use ice pack, Tylenol, and ibuprofen also for pain  Call our office for any notes needed regarding work.  You may return to work on Monday January 12, 2019  Our office phone number is [phone]    No Tylenol until 1:30pm, no ibuprofen until 3:30    Post Anesthesia Home Care Instructions  Activity: Get plenty of rest for the remainder of the day. A responsible individual must stay with you for 24 hours following the procedure.  For the next 24 hours, DO NOT: -Drive a car -Paediatric nurse -Drink alcoholic beverages -Take any medication unless instructed by your physician -Make any legal decisions or sign important papers.  Meals: Start with liquid foods such as gelatin or soup. Progress to regular foods as tolerated. Avoid greasy, spicy, heavy foods. If nausea and/or vomiting occur, drink only clear liquids until the nausea and/or vomiting subsides. Call your physician if vomiting continues.  Special Instructions/Symptoms: Your throat may feel dry or sore from the anesthesia or the breathing tube placed in your throat during surgery. If this causes discomfort, gargle with warm salt water. The discomfort should disappear within 24 hours.  If you had a scopolamine patch placed behind your ear for the management of post- operative nausea and/or vomiting:  1. The medication in the patch is effective for 72 hours, after which it should be removed.  Wrap patch in a tissue and discard in the trash. Wash hands thoroughly with soap and water. 2. You may remove the patch earlier than 72 hours if you experience unpleasant side effects which may include dry mouth, dizziness or visual disturbances. 3. Avoid touching the patch. Wash your hands with soap and water after contact with the patch.

## 2019-01-06 NOTE — Anesthesia Procedure Notes (Signed)
Procedure Name: LMA Insertion °Performed by: Shamone Winzer H, CRNA °Pre-anesthesia Checklist: Patient identified, Emergency Drugs available, Suction available and Patient being monitored °Patient Re-evaluated:Patient Re-evaluated prior to induction °Oxygen Delivery Method: Circle System Utilized °Preoxygenation: Pre-oxygenation with 100% oxygen °Induction Type: IV induction °Ventilation: Mask ventilation without difficulty °LMA: LMA inserted °LMA Size: 4.0 °Number of attempts: 1 °Airway Equipment and Method: Bite block °Placement Confirmation: positive ETCO2 °Tube secured with: Tape °Dental Injury: Teeth and Oropharynx as per pre-operative assessment  ° ° ° ° ° ° °

## 2019-01-06 NOTE — Anesthesia Postprocedure Evaluation (Signed)
Anesthesia Post Note  Patient: Rebekah Irwin  Procedure(s) Performed: EXCISION RIGHT LOWER LEG MASS (Right Leg Lower)     Patient location during evaluation: PACU Anesthesia Type: General Level of consciousness: awake and alert Pain management: pain level controlled Vital Signs Assessment: post-procedure vital signs reviewed and stable Respiratory status: spontaneous breathing, nonlabored ventilation and respiratory function stable Cardiovascular status: blood pressure returned to baseline and stable Postop Assessment: no apparent nausea or vomiting Anesthetic complications: no    Last Vitals:  Vitals:   01/06/19 1000 01/06/19 1023  BP: 124/85 (!) 133/98  Pulse: 87 84  Resp: (!) 23 16  Temp:  37 C  SpO2: 99% 98%    Last Pain:  Vitals:   01/06/19 1023  TempSrc: Oral  PainSc: 0-No pain                 Lynda Rainwater

## 2019-01-06 NOTE — Op Note (Signed)
EXCISION RIGHT LOWER LEG MASS  Procedure Note  Rebekah Irwin 01/06/2019   Pre-op Diagnosis: RIGHT LOWER LEG MASS     Post-op Diagnosis: same  Procedure(s): EXCISION RIGHT LOWER LEG MASS (2 cm)  Surgeon(s): Coralie Keens, MD  Anesthesia: General  Staff:  Circulator: McDonough-Hughes, Delene Ruffini, RN Relief Circulator: Maurene Capes, RN Scrub Person: Bethena Roys, [place], NT  Estimated Blood Loss: Minimal               Specimens: sent to path  Indications: This is a 44 year old female presents with a right lower leg mass in the medial calf area.  She underwent an MRI showing a 1.9 mm mass in the soft tissue which was worrisome for a malignancy.  The decision was made to proceed with wide excision  Procedure: The patient was brought to the operating identifies correct patient.  She is placed upon the operating table and general anesthesia was induced.  I then placed the right leg in a frog-leg position.  Her right leg was then prepped and draped in usual sterile fashion.  I anesthetized skin around the palpable mass with Marcaine and performed elliptical incision with a scalpel.  I then widely excised the subcutaneous mass with electrocautery taking all tissue down to the muscle fascia and circumferentially.  The lesion appeared to be just underneath the skin which was taken as well.  The mass was sent to pathology for evaluation.  I achieved hemostasis with cautery.  I anesthetized the incision further with Marcaine.  I then closed the subcutaneous tissue with interrupted 3-0 Vicryl sutures and closed the skin with a running 4-0 Monocryl.  Dermabond was then applied.  The patient tolerated the procedure well.  All the counts were correct at the end of the procedure.  The patient was then extubated in the operating room and taken in stable addition to the recovery room.          Coralie Keens   Date: 01/06/2019  Time: 9:48 AM

## 2019-01-06 NOTE — Transfer of Care (Signed)
Immediate Anesthesia Transfer of Care Note  Patient: Rebekah Irwin  Procedure(s) Performed: EXCISION RIGHT LOWER LEG MASS (Right Leg Lower)  Patient Location: PACU  Anesthesia Type:General  Level of Consciousness: awake  Airway & Oxygen Therapy: Patient Spontanous Breathing  Post-op Assessment: Report given to RN and Post -op Vital signs reviewed and stable  Post vital signs: Reviewed and stable  Last Vitals:  Vitals Value Taken Time  BP 118/83 01/06/19 0953  Temp    Pulse 86 01/06/19 0956  Resp 28 01/06/19 0956  SpO2 100 % 01/06/19 0956  Vitals shown include unvalidated device data.  Last Pain:  Vitals:   01/06/19 0725  TempSrc: Oral  PainSc: 0-No pain         Complications: No apparent anesthesia complications

## 2019-01-06 NOTE — Interval H&P Note (Signed)
History and Physical Interval Note: no change in H and P  01/06/2019 9:07 AM  Rebekah Irwin  has presented today for surgery, with the diagnosis of RIGHT LOWER LEG MASS.  The various methods of treatment have been discussed with the patient and family. After consideration of risks, benefits and other options for treatment, the patient has consented to  Procedure(s): EXCISION RIGHT LOWER LEG MASS (Right) as a surgical intervention.  The patient's history has been reviewed, patient examined, no change in status, stable for surgery.  I have reviewed the patient's chart and labs.  Questions were answered to the patient's satisfaction.     Coralie Keens

## 2019-01-06 NOTE — Anesthesia Preprocedure Evaluation (Signed)
Anesthesia Evaluation  Patient identified by MRN, date of birth, ID band Patient awake    Reviewed: Allergy & Precautions, NPO status , Patient's Chart, lab work & pertinent test results  Airway Mallampati: II  TM Distance: >3 FB Neck ROM: Full    Dental no notable dental hx.    Pulmonary neg pulmonary ROS, former smoker,    Pulmonary exam normal breath sounds clear to auscultation       Cardiovascular hypertension, Normal cardiovascular exam Rhythm:Regular Rate:Normal     Neuro/Psych Anxiety Depression negative neurological ROS  negative psych ROS   GI/Hepatic negative GI ROS, Neg liver ROS,   Endo/Other  negative endocrine ROS  Renal/GU negative Renal ROS  negative genitourinary   Musculoskeletal negative musculoskeletal ROS (+)   Abdominal (+) + obese,   Peds negative pediatric ROS (+)  Hematology negative hematology ROS (+)   Anesthesia Other Findings   Reproductive/Obstetrics negative OB ROS                             Anesthesia Physical Anesthesia Plan  ASA: II  Anesthesia Plan: General   Post-op Pain Management:    Induction: Intravenous  PONV Risk Score and Plan: 3 and Ondansetron, Dexamethasone, Midazolam and Treatment may vary due to age or medical condition  Airway Management Planned: LMA and Oral ETT  Additional Equipment:   Intra-op Plan:   Post-operative Plan: Extubation in OR  Informed Consent: I have reviewed the patients History and Physical, chart, labs and discussed the procedure including the risks, benefits and alternatives for the proposed anesthesia with the patient or authorized representative who has indicated his/her understanding and acceptance.     Dental advisory given  Plan Discussed with: CRNA  Anesthesia Plan Comments:         Anesthesia Quick Evaluation

## 2019-01-08 LAB — SURGICAL PATHOLOGY

## 2019-01-13 ENCOUNTER — Encounter: Payer: Self-pay | Admitting: Internal Medicine

## 2019-01-15 ENCOUNTER — Telehealth: Payer: Self-pay

## 2019-01-15 MED ORDER — FLUOXETINE HCL 20 MG PO TABS: 20.0000 mg | ORAL_TABLET | Freq: Every day | ORAL | 3 refills | Status: DC

## 2019-01-15 MED ORDER — FLUOXETINE HCL 20 MG PO CAPS: 20.0000 mg | ORAL_CAPSULE | Freq: Every day | ORAL | 2 refills | Status: DC

## 2019-01-15 MED FILL — FLUoxetine HCL 20 MG CAPS: 20 | 30 days supply | Qty: 30 | Fill #0

## 2019-01-15 NOTE — Addendum Note (Signed)
Addended by: Jearld Fenton on: 01/15/2019 09:11 AM   Modules accepted: Orders

## 2019-01-15 NOTE — Telephone Encounter (Signed)
Anderson Malta from [facility] called asking if Fluoxetine RX can be changed to capsules instead. This will be cheaper for patient and patient was ok with this change. Please review.

## 2019-01-15 NOTE — Telephone Encounter (Signed)
RX sent to pharmacy  

## 2019-02-11 MED FILL — FLUoxetine HCL 20 MG CAPS: 20 | 30 days supply | Qty: 30 | Fill #1

## 2019-03-24 MED FILL — FLUoxetine HCL 20 MG CAPS: 20 | 30 days supply | Qty: 30 | Fill #2

## 2019-04-03 MED FILL — FLUoxetine HCL 20 MG CAPS: 20 | 30 days supply | Qty: 30 | Fill #2

## 2019-04-15 ENCOUNTER — Encounter: Payer: Self-pay | Admitting: Internal Medicine

## 2019-04-16 NOTE — Telephone Encounter (Signed)
Please have her schedule virtual visit to discuss.

## 2019-04-23 ENCOUNTER — Telehealth (INDEPENDENT_AMBULATORY_CARE_PROVIDER_SITE_OTHER): Payer: 59 | Admitting: Internal Medicine

## 2019-04-23 ENCOUNTER — Encounter: Payer: Self-pay | Admitting: Internal Medicine

## 2019-04-23 DIAGNOSIS — F329 Major depressive disorder, single episode, unspecified: Secondary | ICD-10-CM

## 2019-04-23 DIAGNOSIS — F419 Anxiety disorder, unspecified: Secondary | ICD-10-CM | POA: Diagnosis not present

## 2019-04-23 MED ORDER — FLUOXETINE HCL 40 MG PO CAPS: 40.0000 mg | ORAL_CAPSULE | Freq: Every day | ORAL | 1 refills | Status: DC

## 2019-04-23 MED FILL — FLUoxetine HCL 40 MG CAPS: 40 | 90 days supply | Qty: 90 | Fill #0

## 2019-04-23 NOTE — Assessment & Plan Note (Signed)
Increase Fluoxetine to 40 mg daily Support offered

## 2019-04-23 NOTE — Progress Notes (Signed)
Virtual Visit via Video Note  I connected with Rebekah Irwin on 04/23/19 at 12:00 PM EDT by a video enabled telemedicine application and verified that I am speaking with the correct person using two identifiers.  Location: Patient: Home Provider: Office   I discussed the limitations of evaluation and management by telemedicine and the availability of in person appointments. The patient expressed understanding and agreed to proceed.  History of Present Illness:  Pt wants to follow up anxiety. She is currently managed on Fluoxetine. She does not feel like it is effective as it used to be. She is feeling more anxious and on edge. She is easily annoyed. She denies depression, SI/HI.  Past Medical History:  Diagnosis Date  . Allergy    Seasonal allergies   . Anxiety   . Postpartum care following cesarean delivery (2/15) 02/23/2015    Current Outpatient Medications  Medication Sig Dispense Refill  . FLUoxetine (PROZAC) 20 MG capsule Take 1 capsule (20 mg total) by mouth daily. 30 capsule 2  . Omega-3 Fatty Acids (FISH OIL PO) Take 1 capsule by mouth daily.    Marland Kitchen oxyCODONE (OXY IR/ROXICODONE) 5 MG immediate release tablet Take 1 tablet (5 mg total) by mouth every 6 (six) hours as needed for moderate pain or severe pain. 25 tablet 0   No current facility-administered medications for this visit.    No Known Allergies  Family History  Problem Relation Age of Onset  . Hypertension Mother   . Diabetes Father   . Anuerysm Maternal Aunt   . Stroke Maternal Grandmother   . Cancer Neg Hx   . Early death Neg Hx     Social History   Socioeconomic History  . Marital status: Single    Spouse name: Not on file  . Number of children: 0  . Years of education: 69  . Highest education level: Not on file  Occupational History  . Occupation: Corporate treasurer  Tobacco Use  . Smoking status: Former Smoker    Types: Cigarettes  . Smokeless tobacco: Never Used  Substance and Sexual  Activity  . Alcohol use: Yes    Alcohol/week: 1.0 standard drinks    Types: 1 Glasses of wine per week  . Drug use: No  . Sexual activity: Yes    Birth control/protection: I.U.D.  Other Topics Concern  . Not on file  Social History Narrative   Regular exercise-no   Caffeine Use-yes   Social Determinants of Health   Financial Resource Strain:   . Difficulty of Paying Living Expenses:   Food Insecurity:   . Worried About Charity fundraiser in the Last Year:   . Arboriculturist in the Last Year:   Transportation Needs:   . Film/video editor (Medical):   Marland Kitchen Lack of Transportation (Non-Medical):   Physical Activity:   . Days of Exercise per Week:   . Minutes of Exercise per Session:   Stress:   . Feeling of Stress :   Social Connections:   . Frequency of Communication with Friends and Family:   . Frequency of Social Gatherings with Friends and Family:   . Attends Religious Services:   . Active Member of Clubs or Organizations:   . Attends Archivist Meetings:   Marland Kitchen Marital Status:   Intimate Partner Violence:   . Fear of Current or Ex-Partner:   . Emotionally Abused:   Marland Kitchen Physically Abused:   . Sexually Abused:  Constitutional: Denies fever, malaise, fatigue, headache or abrupt weight changes.  Respiratory: Denies difficulty breathing, shortness of breath, cough or sputum production.   Cardiovascular: Denies chest pain, chest tightness, palpitations or swelling in the hands or feet.  Neurological: Denies dizziness, difficulty with memory, difficulty with speech or problems with balance and coordination.  Psych: Pt reports anxiety. Denies depression, SI/HI.  No other specific complaints in a complete review of systems (except as listed in HPI above).   Observations/Objective:   Wt Readings from Last 3 Encounters:  01/06/19 179 lb 3.7 oz (81.3 kg)  11/21/18 182 lb (82.6 kg)  12/05/14 214 lb 12.8 oz (97.4 kg)    General: Appears her stated age, well  developed, well nourished in NAD. Pulmonary/Chest: Normal effort. No respiratory distress.  Neurological: Alert and oriented.   Psychiatric: Mood and affect normal. Behavior is normal. Judgment and thought content normal.     BMET    Component Value Date/Time   NA 135 05/28/2017 1008   K 3.7 05/28/2017 1008   CL 100 05/28/2017 1008   CO2 25 05/28/2017 1008   GLUCOSE 89 05/28/2017 1008   BUN 9 05/28/2017 1008   CREATININE 0.71 05/28/2017 1008   CALCIUM 9.1 05/28/2017 1008   GFRNONAA >60 10/22/2014 1530   GFRAA >60 10/22/2014 1530    Lipid Panel     Component Value Date/Time   CHOL 205 (H) 05/28/2017 1008   TRIG 113.0 05/28/2017 1008   HDL 84.30 05/28/2017 1008   CHOLHDL 2 05/28/2017 1008   VLDL 22.6 05/28/2017 1008   LDLCALC 98 05/28/2017 1008    CBC    Component Value Date/Time   WBC 3.9 (L) 05/28/2017 1008   RBC 4.41 05/28/2017 1008   HGB 14.0 05/28/2017 1008   HCT 41.4 05/28/2017 1008   PLT 267.0 05/28/2017 1008   MCV 93.9 05/28/2017 1008   MCH 28.2 02/24/2015 0550   MCHC 33.7 05/28/2017 1008   RDW 12.4 05/28/2017 1008    Hgb A1C Lab Results  Component Value Date   HGBA1C 5.5 12/21/2011        Assessment and Plan:   Follow Up Instructions:    I discussed the assessment and treatment plan with the patient. The patient was provided an opportunity to ask questions and all were answered. The patient agreed with the plan and demonstrated an understanding of the instructions.   The patient was advised to call back or seek an in-person evaluation if the symptoms worsen or if the condition fails to improve as anticipated.    Webb Silversmith, NP

## 2019-04-23 NOTE — Patient Instructions (Signed)

## 2019-07-20 MED FILL — FLUoxetine HCL 40 MG CAPS: 40 | 90 days supply | Qty: 90 | Fill #1

## 2019-10-02 ENCOUNTER — Other Ambulatory Visit: Payer: Self-pay | Admitting: Internal Medicine

## 2019-10-07 ENCOUNTER — Other Ambulatory Visit: Payer: Self-pay | Admitting: Internal Medicine

## 2019-10-12 MED FILL — FLUoxetine HCL 40 MG CAPS: 40 | 90 days supply | Qty: 90 | Fill #0

## 2019-11-13 ENCOUNTER — Other Ambulatory Visit (HOSPITAL_COMMUNITY): Payer: Self-pay | Admitting: Internal Medicine

## 2019-11-13 MED FILL — FLUARIX QUADRIVALENT 0.5 ML: 0.5 | 1 days supply | Qty: 1 | Fill #0

## 2020-03-14 ENCOUNTER — Other Ambulatory Visit: Payer: Self-pay | Admitting: Internal Medicine

## 2020-03-15 ENCOUNTER — Other Ambulatory Visit: Payer: Self-pay | Admitting: Internal Medicine

## 2020-03-15 MED FILL — FLUoxetine HCL 40 MG CAPS: 40 | 90 days supply | Qty: 90 | Fill #0

## 2020-06-30 ENCOUNTER — Other Ambulatory Visit (HOSPITAL_COMMUNITY): Payer: Self-pay

## 2020-06-30 ENCOUNTER — Other Ambulatory Visit: Payer: Self-pay | Admitting: Internal Medicine

## 2020-06-30 MED ORDER — FLUOXETINE HCL 40 MG PO CAPS
40.0000 mg | ORAL_CAPSULE | Freq: Every day | ORAL | 0 refills | Status: DC
  Filled 2020-06-30: qty 90, 90d supply, fill #0

## 2020-11-03 ENCOUNTER — Other Ambulatory Visit (HOSPITAL_COMMUNITY): Payer: Self-pay

## 2020-11-03 MED ORDER — INFLUENZA VAC SPLIT QUAD 0.5 ML IM SUSY
0.5000 mL | PREFILLED_SYRINGE | INTRAMUSCULAR | 0 refills | Status: DC
  Filled 2020-11-03: qty 0.5, 1d supply, fill #0

## 2020-11-09 ENCOUNTER — Other Ambulatory Visit (HOSPITAL_COMMUNITY): Payer: Self-pay

## 2020-11-09 ENCOUNTER — Other Ambulatory Visit: Payer: Self-pay | Admitting: Family

## 2020-11-15 ENCOUNTER — Other Ambulatory Visit (HOSPITAL_COMMUNITY): Payer: Self-pay

## 2020-11-15 ENCOUNTER — Other Ambulatory Visit: Payer: Self-pay | Admitting: Internal Medicine

## 2020-11-15 NOTE — Telephone Encounter (Signed)
Pt has a TOC with [facility] on 11/15 and also her medication needs to be refilled-prozac

## 2020-11-16 ENCOUNTER — Other Ambulatory Visit (HOSPITAL_COMMUNITY): Payer: Self-pay

## 2020-11-16 MED ORDER — FLUOXETINE HCL 40 MG PO CAPS
40.0000 mg | ORAL_CAPSULE | Freq: Every day | ORAL | 0 refills | Status: DC
  Filled 2020-11-16 – 2020-11-28 (×2): qty 30, 30d supply, fill #0

## 2020-11-22 ENCOUNTER — Encounter: Payer: 59 | Admitting: Nurse Practitioner

## 2020-11-23 ENCOUNTER — Ambulatory Visit (INDEPENDENT_AMBULATORY_CARE_PROVIDER_SITE_OTHER): Payer: Self-pay | Admitting: Nurse Practitioner

## 2020-11-23 ENCOUNTER — Encounter: Payer: Self-pay | Admitting: Nurse Practitioner

## 2020-11-23 ENCOUNTER — Other Ambulatory Visit: Payer: Self-pay

## 2020-11-23 VITALS — BP 132/88 | HR 102 | Temp 97.8°F | Resp 12 | Ht 62.0 in | Wt 207.1 lb

## 2020-11-23 DIAGNOSIS — R635 Abnormal weight gain: Secondary | ICD-10-CM | POA: Insufficient documentation

## 2020-11-23 DIAGNOSIS — E6609 Other obesity due to excess calories: Secondary | ICD-10-CM | POA: Insufficient documentation

## 2020-11-23 DIAGNOSIS — I10 Essential (primary) hypertension: Secondary | ICD-10-CM

## 2020-11-23 DIAGNOSIS — Z6837 Body mass index (BMI) 37.0-37.9, adult: Secondary | ICD-10-CM | POA: Insufficient documentation

## 2020-11-23 DIAGNOSIS — F419 Anxiety disorder, unspecified: Secondary | ICD-10-CM

## 2020-11-23 NOTE — Assessment & Plan Note (Signed)
Medicine office visit today.  Patient has not been on medication in some time states that she does manage her blood pressure now with diet and exercise.  Continue on the same.

## 2020-11-23 NOTE — Patient Instructions (Signed)
Nice to see you Rebekah Irwin order your labs and have them drawn in the morning. Once I get those we can talk about the medication and follow up

## 2020-11-23 NOTE — Assessment & Plan Note (Signed)
Encourage lifestyle modifications we will check labs today.  Pending results continue working on lifestyle modifications

## 2020-11-23 NOTE — Progress Notes (Signed)
Established Patient Office Visit  Subjective:  Patient ID: Rebekah Irwin, female    DOB: 03-02-1975  Age: 45 y.o. MRN: [id number]  CC:  Chief Complaint  Patient presents with   Transfer of Care    HPI Rebekah Irwin presents for [facility]  HTN: Not currently on antihypertensive medication. Food prepping. Limiting eating out and going to the vending machine at work. Eating twice daily with snacks in between. 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 Does not take it everyday. Sometimes forget.   Seasonal allergies: benadryl prn.  Weight loss: Patient has been trying to be more mindful of what she is eating and intake. Has lost some weight on her own and is interested in medical assisted therapy to continue her weight loss journey  Past Medical History:  Diagnosis Date   Allergy    Seasonal allergies    Anxiety    Postpartum care following cesarean delivery (2/15) 02/23/2015    Past Surgical History:  Procedure Laterality Date   BREAST SURGERY     CESAREAN SECTION N/A 02/23/2015   Procedure: CESAREAN SECTION;  Surgeon: Servando Salina, MD;  Location: [facility];  Service: Obstetrics;  Laterality: N/A;   MASS EXCISION Right 01/06/2019   Procedure: EXCISION RIGHT LOWER LEG MASS;  Surgeon: Coralie Keens, MD;  Location: [facility];  Service: General;  Laterality: Right;    Family History  Problem Relation Age of Onset   Hypertension Mother    Diabetes Father    Anuerysm Maternal Aunt    Stroke Maternal Grandmother    Cancer Neg Hx    Early death Neg Hx     Social History   Socioeconomic History   Marital status: Single    Spouse name: Not on file   Number of children: 0   Years of education: 14   Highest education level: Not on file  Occupational History   Occupation: Corporate treasurer  Tobacco Use   Smoking status: Former    Types: Cigarettes   Smokeless tobacco: Never  Vaping Use    Vaping Use: Not on file  Substance and Sexual Activity   Alcohol use: Yes    Alcohol/week: 1.0 standard drink    Types: 1 Glasses of wine per week   Drug use: No   Sexual activity: Yes    Birth control/protection: I.U.D.  Other Topics Concern   Not on file  Social History Narrative   Regular exercise-no   Caffeine Use-yes   Social Determinants of Health   Financial Resource Strain: Not on file  Food Insecurity: Not on file  Transportation Needs: Not on file  Physical Activity: Not on file  Stress: Not on file  Social Connections: Not on file  Intimate Partner Violence: Not on file    Outpatient Medications Prior to Visit  Medication Sig Dispense Refill   FLUoxetine (PROZAC) 40 MG capsule Take 1 capsule (40 mg total) by mouth daily. 30 capsule 0   influenza vac split quadrivalent PF (FLUARIX) 0.5 ML injection Inject 0.5 mLs into the muscle. 0.5 mL 0   Omega-3 Fatty Acids (FISH OIL PO) Take 1 capsule by mouth daily.     No facility-administered medications prior to visit.    No Known Allergies  ROS Review of Systems  Constitutional:  Negative for chills, fatigue and fever.  Respiratory:  Positive for shortness of breath (3-4 flights of steps). Negative for cough.   Cardiovascular:  Negative  for chest pain.  Gastrointestinal:  Negative for abdominal pain, blood in stool, diarrhea, nausea and vomiting.  Genitourinary:  Negative for dysuria and hematuria.  Neurological:  Negative for weakness, numbness and headaches.  Psychiatric/Behavioral:  Negative for hallucinations and suicidal ideas.      Objective:    Physical Exam Vitals and nursing note reviewed.  Constitutional:      Appearance: She is obese.  HENT:     Right Ear: Tympanic membrane, ear canal and external ear normal. There is no impacted cerumen.     Left Ear: Tympanic membrane, ear canal and external ear normal. There is no impacted cerumen.     Mouth/Throat:     Mouth: Mucous membranes are moist.      Pharynx: Oropharynx is clear.  Eyes:     Extraocular Movements: Extraocular movements intact.     Pupils: Pupils are equal, round, and reactive to light.  Cardiovascular:     Rate and Rhythm: Normal rate and regular rhythm.  Pulmonary:     Effort: Pulmonary effort is normal.     Breath sounds: Normal breath sounds.  Abdominal:     General: Bowel sounds are normal.  Neurological:     Mental Status: She is alert.     Deep Tendon Reflexes:     Reflex Scores:      Bicep reflexes are 2+ on the right side and 2+ on the left side.      Patellar reflexes are 2+ on the right side and 2+ on the left side.    Comments: Bilateral upper and lower extremity strength 5/5  Psychiatric:        Mood and Affect: Mood normal.        Behavior: Behavior normal.        Thought Content: Thought content normal.        Judgment: Judgment normal.    There were no vitals taken for this visit. Wt Readings from Last 3 Encounters:  01/06/19 179 lb 3.7 oz (81.3 kg)  11/21/18 182 lb (82.6 kg)  12/05/14 214 lb 12.8 oz (97.4 kg)     Health Maintenance Due  Topic Date Due   COVID-19 Vaccine (1) Never done   Hepatitis C Screening  Never done   PAP SMEAR-Modifier  12/20/2013   TETANUS/TDAP  12/21/2015   COLONOSCOPY (Pts 45-8yrs Insurance coverage will need to be confirmed)  Never done    There are no preventive care reminders to display for this patient.  Lab Results  Component Value Date   TSH 2.10 12/21/2011   Lab Results  Component Value Date   WBC 3.9 (L) 05/28/2017   HGB 14.0 05/28/2017   HCT 41.4 05/28/2017   MCV 93.9 05/28/2017   PLT 267.0 05/28/2017   Lab Results  Component Value Date   NA 135 05/28/2017   K 3.7 05/28/2017   CO2 25 05/28/2017   GLUCOSE 89 05/28/2017   BUN 9 05/28/2017   CREATININE 0.71 05/28/2017   BILITOT 0.9 05/28/2017   ALKPHOS 76 05/28/2017   AST 17 05/28/2017   ALT 11 05/28/2017   PROT 7.6 05/28/2017   ALBUMIN 4.0 05/28/2017   CALCIUM 9.1 05/28/2017    ANIONGAP 4 (L) 10/22/2014   GFR 116.03 05/28/2017   Lab Results  Component Value Date   CHOL 205 (H) 05/28/2017   Lab Results  Component Value Date   HDL 84.30 05/28/2017   Lab Results  Component Value Date   LDLCALC 98 05/28/2017   Lab  Results  Component Value Date   TRIG 113.0 05/28/2017   Lab Results  Component Value Date   CHOLHDL 2 05/28/2017   Lab Results  Component Value Date   HGBA1C 5.5 12/21/2011      Assessment & Plan:   Problem List Items Addressed This Visit       Cardiovascular and Mediastinum   Hypertension    Medicine office visit today.  Patient has not been on medication in some time states that she does manage her blood pressure now with diet and exercise.  Continue on the same.        Other   Anxiety    Early managed with fluoxetine 40 mg.  Patient taking medication some days per her report.  Did discuss that this medication is designed to be taken daily to have a greater and more consistent therapeutic effect.  Patient acknowledged.  Did administer PHQ-9 and GAD 7 in office continue fluoxetine 40 mg.      Weight gain - Primary    Patient's weight was as low as 179 in the year 2020.  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  Pending lab results do not think patient would be a candidate for phentermine therapy given history of hypertension that did require medication to control.  Did discuss this with patient.  Depending on lab works will consider GLP-1 receptor agonist.  Patient states she has never had pancreatitis, Mens 2, thyroid cancer personally or in family history      Relevant Orders   CBC   Comprehensive metabolic panel   Lipid panel   TSH   Hemoglobin A1c   Class 2 obesity due to excess calories without serious comorbidity with body mass index (BMI) of 37.0 to 37.9 in adult    Encourage lifestyle modifications we will check labs today.  Pending results continue working on lifestyle  modifications      Relevant Orders   CBC   Comprehensive metabolic panel   Lipid panel   TSH   Hemoglobin A1c    No orders of the defined types were placed in this encounter.   Follow-up: Return in about 1 day (around 11/24/2020) for lab appointment.   This visit occurred during the SARS-CoV-2 public health emergency.  Safety protocols were in place, including screening questions prior to the visit, additional usage of staff PPE, and extensive cleaning of exam room while observing appropriate contact time as indicated for disinfecting solutions.   Romilda Garret, NP

## 2020-11-23 NOTE — Assessment & Plan Note (Signed)
Early managed with fluoxetine 40 mg.  Patient taking medication some days per her report.  Did discuss that this medication is designed to be taken daily to have a greater and more consistent therapeutic effect.  Patient acknowledged.  Did administer PHQ-9 and GAD 7 in office continue fluoxetine 40 mg.

## 2020-11-23 NOTE — Assessment & Plan Note (Addendum)
Patient's weight was as low as 179 in the year 2020.  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  Pending lab results do not think patient would be a candidate for phentermine therapy given history of hypertension that did require medication to control.  Did discuss this with patient.  Depending on lab works will consider GLP-1 receptor agonist.  Patient states she has never had pancreatitis, Mens 2, thyroid cancer personally or in family history

## 2020-11-24 ENCOUNTER — Other Ambulatory Visit (HOSPITAL_COMMUNITY): Payer: Self-pay

## 2020-11-24 ENCOUNTER — Other Ambulatory Visit: Payer: Self-pay

## 2020-11-28 ENCOUNTER — Other Ambulatory Visit: Payer: Self-pay

## 2020-11-28 ENCOUNTER — Other Ambulatory Visit (HOSPITAL_COMMUNITY): Payer: Self-pay

## 2020-12-05 ENCOUNTER — Other Ambulatory Visit: Payer: Self-pay

## 2020-12-07 ENCOUNTER — Other Ambulatory Visit: Payer: Self-pay

## 2020-12-07 ENCOUNTER — Other Ambulatory Visit (INDEPENDENT_AMBULATORY_CARE_PROVIDER_SITE_OTHER): Payer: Self-pay

## 2020-12-07 DIAGNOSIS — Z6837 Body mass index (BMI) 37.0-37.9, adult: Secondary | ICD-10-CM | POA: Diagnosis not present

## 2020-12-07 DIAGNOSIS — E6609 Other obesity due to excess calories: Secondary | ICD-10-CM | POA: Diagnosis not present

## 2020-12-07 DIAGNOSIS — R635 Abnormal weight gain: Secondary | ICD-10-CM

## 2020-12-08 ENCOUNTER — Encounter: Payer: Self-pay | Admitting: Nurse Practitioner

## 2020-12-08 LAB — COMPREHENSIVE METABOLIC PANEL
ALT: 13 U/L (ref 0–35)
AST: 14 U/L (ref 0–37)
Albumin: 4.1 g/dL (ref 3.5–5.2)
Alkaline Phosphatase: 117 U/L (ref 39–117)
BUN: 8 mg/dL (ref 6–23)
CO2: 30 mEq/L (ref 19–32)
Calcium: 8.9 mg/dL (ref 8.4–10.5)
Chloride: 104 mEq/L (ref 96–112)
Creatinine, Ser: 0.72 mg/dL (ref 0.40–1.20)
GFR: 100.81 mL/min (ref >60.00)
Glucose, Bld: 75 mg/dL (ref 70–99)
Potassium: 3.7 mEq/L (ref 3.5–5.1)
Sodium: 140 mEq/L (ref 135–145)
Total Bilirubin: 0.3 mg/dL (ref 0.2–1.2)
Total Protein: 7.3 g/dL (ref 6.0–8.3)

## 2020-12-08 LAB — CBC
HCT: 36.7 % (ref 36.0–46.0)
Hemoglobin: 12.2 g/dL (ref 12.0–15.0)
MCHC: 33.3 g/dL (ref 30.0–36.0)
MCV: 83.8 fl (ref 78.0–100.0)
Platelets: 335 10*3/uL (ref 150.0–400.0)
RBC: 4.38 Mil/uL (ref 3.87–5.11)
RDW: 13.4 % (ref 11.5–15.5)
WBC: 6 10*3/uL (ref 4.0–10.5)

## 2020-12-08 LAB — LIPID PANEL
Cholesterol: 174 mg/dL (ref 0–200)
HDL: 55.6 mg/dL (ref >39.00)
LDL Cholesterol: 90 mg/dL (ref 0–99)
NonHDL: 117.94
Total CHOL/HDL Ratio: 3
Triglycerides: 142 mg/dL (ref 0.0–149.0)
VLDL: 28.4 mg/dL (ref 0.0–40.0)

## 2020-12-08 LAB — TSH: TSH: 1.52 u[IU]/mL (ref 0.35–5.50)

## 2020-12-08 LAB — HEMOGLOBIN A1C: Hgb A1c MFr Bld: 5.7 % (ref 4.6–6.5)

## 2020-12-09 ENCOUNTER — Other Ambulatory Visit (HOSPITAL_COMMUNITY): Payer: Self-pay

## 2020-12-09 ENCOUNTER — Other Ambulatory Visit: Payer: Self-pay | Admitting: Nurse Practitioner

## 2020-12-09 DIAGNOSIS — R635 Abnormal weight gain: Secondary | ICD-10-CM

## 2020-12-09 DIAGNOSIS — Z6837 Body mass index (BMI) 37.0-37.9, adult: Secondary | ICD-10-CM

## 2020-12-09 DIAGNOSIS — E6609 Other obesity due to excess calories: Secondary | ICD-10-CM

## 2020-12-09 MED ORDER — SAXENDA 18 MG/3ML ~~LOC~~ SOPN
PEN_INJECTOR | SUBCUTANEOUS | 0 refills | Status: DC
  Filled 2020-12-09: qty 10.5, 35d supply, fill #0
  Filled 2020-12-15: qty 15, 35d supply, fill #0
  Filled 2020-12-15: qty 15, 30d supply, fill #0
  Filled 2021-01-10: qty 15, 44d supply, fill #0

## 2020-12-12 ENCOUNTER — Other Ambulatory Visit (HOSPITAL_COMMUNITY): Payer: Self-pay

## 2020-12-15 ENCOUNTER — Other Ambulatory Visit (HOSPITAL_COMMUNITY): Payer: Self-pay

## 2020-12-15 NOTE — Telephone Encounter (Signed)
Pt called in stated pharmacy was unable to fill Rx Liraglutide -Weight Management ([place]) 18 MG/3ML SOPN stated they have questions would like for you to clarify . Please Advise [phone]

## 2020-12-19 NOTE — Telephone Encounter (Signed)
This can wait for [facility]. Thanks.

## 2020-12-19 NOTE — Telephone Encounter (Signed)
Let patient know that PA was denied and the reason. Sending to [facility] for review if he has any other recommendations. Patient was advised that Catalina Antigua is out of the office this week.  This is the feedback from insurance "Per their guidelines it states you would need to be enrolled in an exercise and caloric reduction program or a weight loss/behavioral modification program first.

## 2020-12-26 ENCOUNTER — Telehealth: Payer: Self-pay | Admitting: Nurse Practitioner

## 2020-12-26 NOTE — Telephone Encounter (Signed)
This message is pertaining to the pt message from 12/08/20. Pt is checking on status of getting her PA approved for her Liraglutide -Weight Management ([place]) 18 MG/3ML SOPN [[id number]]. Please advise pt

## 2020-12-27 NOTE — Telephone Encounter (Signed)
I just filled out the appeal paperwork and also just sent her a mychart message also. Does no look like she saw my other one. We are waiting to hear back from the insurance company about a decision.   If you do not mind letting her know

## 2020-12-28 NOTE — Telephone Encounter (Signed)
Pt notified as instructed and voiced understanding and will wait to hear back about decision from the ins. Co.

## 2021-01-10 ENCOUNTER — Other Ambulatory Visit (HOSPITAL_COMMUNITY): Payer: Self-pay

## 2021-01-10 ENCOUNTER — Other Ambulatory Visit: Payer: Self-pay | Admitting: Nurse Practitioner

## 2021-01-10 DIAGNOSIS — R635 Abnormal weight gain: Secondary | ICD-10-CM

## 2021-01-10 DIAGNOSIS — E6609 Other obesity due to excess calories: Secondary | ICD-10-CM

## 2021-01-10 MED ORDER — INSULIN PEN NEEDLE 32G X 4 MM MISC
1.0000 "application " | Freq: Every day | 1 refills | Status: AC
  Filled 2021-01-10: qty 100, 90d supply, fill #0
  Filled 2021-05-03: qty 100, 90d supply, fill #1

## 2021-01-10 MED ORDER — SAXENDA 18 MG/3ML ~~LOC~~ SOPN
PEN_INJECTOR | SUBCUTANEOUS | 0 refills | Status: AC
  Filled 2021-01-10: qty 15, 30d supply, fill #0

## 2021-01-10 NOTE — Telephone Encounter (Signed)
Appeal approved. See mychart message from patient with a copy of letter from the insurance. Patient aware. FYI to PCP Dates approval good through 01/03/21-05/03/21 for # 15 ml per 30 days.

## 2021-01-11 ENCOUNTER — Other Ambulatory Visit (HOSPITAL_COMMUNITY): Payer: Self-pay

## 2021-01-17 ENCOUNTER — Other Ambulatory Visit (HOSPITAL_COMMUNITY): Payer: Self-pay

## 2021-01-18 ENCOUNTER — Other Ambulatory Visit (HOSPITAL_COMMUNITY): Payer: Self-pay

## 2021-01-18 ENCOUNTER — Other Ambulatory Visit: Payer: Self-pay | Admitting: Nurse Practitioner

## 2021-01-18 MED ORDER — FLUOXETINE HCL 40 MG PO CAPS
40.0000 mg | ORAL_CAPSULE | Freq: Every day | ORAL | 1 refills | Status: DC
  Filled 2021-01-18 – 2021-02-08 (×2): qty 30, 30d supply, fill #0

## 2021-01-27 ENCOUNTER — Other Ambulatory Visit (HOSPITAL_COMMUNITY): Payer: Self-pay

## 2021-02-08 ENCOUNTER — Other Ambulatory Visit (HOSPITAL_COMMUNITY): Payer: Self-pay

## 2021-02-16 ENCOUNTER — Other Ambulatory Visit (HOSPITAL_COMMUNITY): Payer: Self-pay

## 2021-03-09 ENCOUNTER — Other Ambulatory Visit: Payer: Self-pay | Admitting: Nurse Practitioner

## 2021-03-09 ENCOUNTER — Other Ambulatory Visit (HOSPITAL_COMMUNITY): Payer: Self-pay

## 2021-03-09 DIAGNOSIS — R635 Abnormal weight gain: Secondary | ICD-10-CM

## 2021-03-09 DIAGNOSIS — E6609 Other obesity due to excess calories: Secondary | ICD-10-CM

## 2021-03-10 ENCOUNTER — Other Ambulatory Visit (HOSPITAL_COMMUNITY): Payer: Self-pay

## 2021-03-10 ENCOUNTER — Other Ambulatory Visit: Payer: Self-pay | Admitting: Nurse Practitioner

## 2021-03-10 DIAGNOSIS — E6609 Other obesity due to excess calories: Secondary | ICD-10-CM

## 2021-03-10 MED ORDER — SAXENDA 18 MG/3ML ~~LOC~~ SOPN
3.0000 mg | PEN_INJECTOR | Freq: Every day | SUBCUTANEOUS | 0 refills | Status: AC
  Filled 2021-03-10: qty 15, 30d supply, fill #0

## 2021-03-10 NOTE — Telephone Encounter (Signed)
Patient made an appointment for 03/14/21 ?

## 2021-03-11 ENCOUNTER — Other Ambulatory Visit (HOSPITAL_COMMUNITY): Payer: Self-pay

## 2021-03-13 ENCOUNTER — Other Ambulatory Visit (HOSPITAL_COMMUNITY): Payer: Self-pay

## 2021-03-14 ENCOUNTER — Ambulatory Visit: Payer: No Typology Code available for payment source | Admitting: Nurse Practitioner

## 2021-04-10 ENCOUNTER — Telehealth (INDEPENDENT_AMBULATORY_CARE_PROVIDER_SITE_OTHER): Payer: No Typology Code available for payment source | Admitting: Nurse Practitioner

## 2021-04-10 ENCOUNTER — Other Ambulatory Visit (HOSPITAL_COMMUNITY): Payer: Self-pay

## 2021-04-10 DIAGNOSIS — T7840XA Allergy, unspecified, initial encounter: Secondary | ICD-10-CM | POA: Diagnosis not present

## 2021-04-10 MED ORDER — CETIRIZINE HCL 10 MG PO TABS
10.0000 mg | ORAL_TABLET | Freq: Every day | ORAL | 0 refills | Status: AC
  Filled 2021-04-10: qty 30, 30d supply, fill #0

## 2021-04-10 MED ORDER — PREDNISONE 20 MG PO TABS
40.0000 mg | ORAL_TABLET | Freq: Every day | ORAL | 0 refills | Status: AC
  Filled 2021-04-10: qty 10, 5d supply, fill #0

## 2021-04-10 MED ORDER — FAMOTIDINE 20 MG PO TABS
20.0000 mg | ORAL_TABLET | Freq: Two times a day (BID) | ORAL | 0 refills | Status: DC
  Filled 2021-04-10: qty 10, 5d supply, fill #0

## 2021-04-10 NOTE — Assessment & Plan Note (Signed)
Patient experiencing allergic reaction due to epoxy resin per patient report.  States reaction started yesterday and has progressed she has tried over-the-counter steroid creams along with coconut oil and oral antihistamine.  Gave very strict signs and symptoms when to be seen in urgent or emergency department.  Patient acknowledged.  Patient in no acute distress on video visit.  We will start patient on prednisone 40 mg for 5 days along with famotidine 40 mg for 5 days and Zyrtec daily. ?

## 2021-04-10 NOTE — Progress Notes (Signed)
? ? Patient ID: Rebekah Irwin, female    DOB: 1975/03/31, 46 y.o.   MRN: [id number] ? ?Virtual visit completed through [employer]ility, a video enabled telemedicine application. Due to national recommendations of social distancing due to COVID-19, a virtual visit is felt to be most appropriate for this patient at this time. Reviewed limitations, risks, security and privacy concerns of performing a virtual visit and the availability of in person appointments. I also reviewed that there may be a patient responsible charge related to this service. The patient agreed to proceed.  ? ?Patient location: home ?Provider location: Financial controller at [facility], office ?Persons participating in this virtual visit: patient, provider  ? ?If any vitals were documented, they were collected by patient at home unless specified below.   ? ?There were no vitals taken for this visit.  ? ?CC: Allergic reaction  ?Subjective:  ? ?HPI: ?Rebekah Irwin is a 46 y.o. female presenting on 04/10/2021 for Allergic Reaction ([place]/rash all over body. Started yesterday 04/09/21-she thinks it was from the chemicals she was using to make earrings yesterday. Has used hydrocortisone, coconut oil, Ibuprofen. No SOB, no choking sensation.) ? ? ?Symptoms started yesterday ?States that she was making ear rings and was using a epoxy resin. ?Started about 7am in the morning yesterday.  Patient states the rash has progressed she does have history of allergic reactions in the past. ?Hydrocortisone cream with aloe, coconut oil, and benadryl.  No acute distress on video or per patient report ? ? ? ? ?Relevant past medical, surgical, family and social history reviewed and updated as indicated. Interim medical history since our last visit reviewed. ?Allergies and medications reviewed and updated. ?Outpatient Medications Prior to Visit  ?Medication Sig Dispense Refill  ? Insulin Pen Needle 32G X 4 MM MISC Use daily as directed 100 each 1  ? levonorgestrel (MIRENA) 20  MCG/DAY IUD 1 each by Intrauterine route once.    ? Liraglutide -Weight Management (SAXENDA) 18 MG/3ML SOPN Inject 3 mg into the skin daily. 15 mL 0  ? Omega-3 Fatty Acids (FISH OIL PO) Take 1 capsule by mouth daily. (Patient not taking: Reported on 04/10/2021)    ? FLUoxetine (PROZAC) 40 MG capsule Take 1 capsule (40 mg total) by mouth daily. 30 capsule 1  ? ?No facility-administered medications prior to visit.  ?  ? ?Per HPI unless specifically indicated in ROS section below ?Review of Systems  ?Constitutional:  Negative for chills and fever.  ?HENT:  Negative for facial swelling, trouble swallowing and voice change.   ?Respiratory:  Negative for cough and shortness of breath.   ?Cardiovascular:  Negative for chest pain.  ?Gastrointestinal:  Positive for diarrhea. Negative for nausea and vomiting.  ?Skin:  Positive for color change and rash.  ?Objective:  ?There were no vitals taken for this visit.  ?Wt Readings from Last 3 Encounters:  ?11/23/20 207 lb 2 oz (94 kg)  ?01/06/19 179 lb 3.7 oz (81.3 kg)  ?11/21/18 182 lb (82.6 kg)  ?  ?  ? ?Physical exam: ?Gen: alert, NAD, not ill appearing ?Pulm: speaks in complete sentences without increased work of breathing ?Psych: normal mood, normal thought content  ? ?   ?Results for orders placed or performed in visit on 12/07/20  ?TSH  ?Result Value Ref Range  ? TSH 1.52 0.35 - 5.50 uIU/mL  ?Hemoglobin A1c  ?Result Value Ref Range  ? Hgb A1c MFr Bld 5.7 4.6 - 6.5 %  ?Lipid panel  ?Result Value Ref  Range  ? Cholesterol 174 0 - 200 mg/dL  ? Triglycerides 142.0 0.0 - 149.0 mg/dL  ? HDL 55.60 >39.00 mg/dL  ? VLDL 28.4 0.0 - 40.0 mg/dL  ? LDL Cholesterol 90 0 - 99 mg/dL  ? Total CHOL/HDL Ratio 3   ? NonHDL 117.94   ?Comprehensive metabolic panel  ?Result Value Ref Range  ? Sodium 140 135 - 145 mEq/L  ? Potassium 3.7 3.5 - 5.1 mEq/L  ? Chloride 104 96 - 112 mEq/L  ? CO2 30 19 - 32 mEq/L  ? Glucose, Bld 75 70 - 99 mg/dL  ? BUN 8 6 - 23 mg/dL  ? Creatinine, Ser 0.72 0.40 - 1.20 mg/dL   ? Total Bilirubin 0.3 0.2 - 1.2 mg/dL  ? Alkaline Phosphatase 117 39 - 117 U/L  ? AST 14 0 - 37 U/L  ? ALT 13 0 - 35 U/L  ? Total Protein 7.3 6.0 - 8.3 g/dL  ? Albumin 4.1 3.5 - 5.2 g/dL  ? GFR 100.81 >60.00 mL/min  ? Calcium 8.9 8.4 - 10.5 mg/dL  ?CBC  ?Result Value Ref Range  ? WBC 6.0 4.0 - 10.5 K/uL  ? RBC 4.38 3.87 - 5.11 Mil/uL  ? Platelets 335.0 150.0 - 400.0 K/uL  ? Hemoglobin 12.2 12.0 - 15.0 g/dL  ? HCT 36.7 36.0 - 46.0 %  ? MCV 83.8 78.0 - 100.0 fl  ? MCHC 33.3 30.0 - 36.0 g/dL  ? RDW 13.4 11.5 - 15.5 %  ? ?Assessment & Plan:  ? ?Problem List Items Addressed This Visit   ? ?  ? Other  ? Allergic reaction - Primary  ?  Patient experiencing allergic reaction due to epoxy resin per patient report.  States reaction started yesterday and has progressed she has tried over-the-counter steroid creams along with coconut oil and oral antihistamine.  Gave very strict signs and symptoms when to be seen in urgent or emergency department.  Patient acknowledged.  Patient in no acute distress on video visit.  We will start patient on prednisone 40 mg for 5 days along with famotidine 40 mg for 5 days and Zyrtec daily. ?  ?  ? Relevant Medications  ? predniSONE (DELTASONE) 20 MG tablet  ? famotidine (PEPCID) 20 MG tablet  ? cetirizine (ZYRTEC) 10 MG tablet  ?  ? ?Meds ordered this encounter  ?Medications  ? predniSONE (DELTASONE) 20 MG tablet  ?  Sig: Take 2 tablets (40 mg total) by mouth daily with breakfast for 5 days. Start today when you get the script. Avoid NSAIDs while on this mediation including ibuprofen, aleve, naproxen, BC/Goody Powder  ?  Dispense:  10 tablet  ?  Refill:  0  ?  Order Specific Question:   Supervising Provider  ?  Answer:   Loura Pardon A [[id number]]  ? famotidine (PEPCID) 20 MG tablet  ?  Sig: Take 1 tablet (20 mg total) by mouth 2 (two) times daily for 5 days.  ?  Dispense:  10 tablet  ?  Refill:  0  ?  Order Specific Question:   Supervising Provider  ?  Answer:   Loura Pardon A [[id number]]  ?  cetirizine (ZYRTEC) 10 MG tablet  ?  Sig: Take 1 tablet (10 mg total) by mouth daily.  ?  Dispense:  30 tablet  ?  Refill:  0  ?  Order Specific Question:   Supervising Provider  ?  Answer:   Loura Pardon A [[id number]]  ? ?No orders  of the defined types were placed in this encounter. ? ? ?I discussed the assessment and treatment plan with the patient. The patient was provided an opportunity to ask questions and all were answered. The patient agreed with the plan and demonstrated an understanding of the instructions. The patient was advised to call back or seek an in-person evaluation if the symptoms worsen or if the condition fails to improve as anticipated. ? ?Follow up plan: ?No follow-ups on file. ? ?Romilda Garret, NP   ?

## 2021-04-12 ENCOUNTER — Ambulatory Visit: Payer: No Typology Code available for payment source | Admitting: Nurse Practitioner

## 2021-04-12 ENCOUNTER — Telehealth: Payer: Self-pay | Admitting: Nurse Practitioner

## 2021-04-12 NOTE — Telephone Encounter (Signed)
Pt called asking for a call back to discuss some concerns. Please advise. ?

## 2021-04-12 NOTE — Telephone Encounter (Signed)
Patient made an appointment for virtual visit with [facility] cable this afternoon and per [facility] patient needs to come in person. I spoke with patient and she stated she could not come in and that's why she wanted a virtual. Advised patient the need to look at her eye and examine better. Patient proceeded to say that this is from having the allergic reaction she spoke to [facility] about recently. Patient states her left side of the face is more swollen than the right side. She is taking all of the medication as prescribed but its not improving that. Her whole left side of the face and eye swollen. She is not having SOB or difficulty breathing. She is wanting to know how to proceed. Patient said if she has to come in she will try to work it out since she is at work at [employer] but not sure if she can make it. ? ?Please call patient back at work at [phone] ?

## 2021-04-12 NOTE — Telephone Encounter (Signed)
If she is unable to come in then she needs to go to an urgent care where they can see her ?

## 2021-04-12 NOTE — Telephone Encounter (Signed)
Spoke with patient. Patient is not able to come in person today. She scheduled for tomorrow 04/13/21 but will try to go to urgent care this evening to have this looked at. If she goes to urgent care she will call back and cancel her appointment here ?

## 2021-04-13 ENCOUNTER — Ambulatory Visit: Payer: No Typology Code available for payment source | Admitting: Nurse Practitioner

## 2021-04-13 NOTE — Telephone Encounter (Signed)
Called patient and spoke to her regarding her concerns.   ? ?Mainly she was just frustrated that she was being told by front office that there was nothing open or available to be seen without any further discussion or consideration.  She apologizes for her part of being rude but states she was frustrated because clearly the person on the phone was either new or wasn't sure what to do or say.  There additionally was no offer to speak with physicians or to see where or how she could be worked in or what her other care options could be.   ? ?I apologized to her for the negative experience and let her know that when we are full we do look at other options including other sister practices who may have availability and also consider whether the providers have communicated areas where they can work in or add on patients from time to time.  We do review those options first as a general rule. We also have to consider the severity level of the symptoms and what is the best support for that.  ? ?Patient states that she is doing better today and improving, she just didn't want that to happen to anyone else.  ? ?I thanked her for her feedback and she appreciated the communication.  ? ? ?

## 2021-04-18 ENCOUNTER — Encounter: Payer: Self-pay | Admitting: Nurse Practitioner

## 2021-04-18 DIAGNOSIS — E6609 Other obesity due to excess calories: Secondary | ICD-10-CM

## 2021-04-18 MED ORDER — SAXENDA 18 MG/3ML ~~LOC~~ SOPN
3.0000 mg | PEN_INJECTOR | Freq: Every day | SUBCUTANEOUS | 0 refills | Status: AC
  Filled 2021-04-18 – 2021-05-01 (×3): qty 15, 30d supply, fill #0

## 2021-04-18 NOTE — Telephone Encounter (Signed)
Can we schedule her for an office visit within a month please ?

## 2021-04-19 ENCOUNTER — Other Ambulatory Visit (HOSPITAL_COMMUNITY): Payer: Self-pay

## 2021-04-19 NOTE — Telephone Encounter (Signed)
Spoke with patient. Patient was at work, she wanted to try and come in tomorrow but provider is out of the office this week. Patient stated she would need to call me back. FYI to PCP ?

## 2021-04-27 ENCOUNTER — Other Ambulatory Visit (HOSPITAL_COMMUNITY): Payer: Self-pay

## 2021-04-28 ENCOUNTER — Other Ambulatory Visit (HOSPITAL_COMMUNITY): Payer: Self-pay

## 2021-05-01 ENCOUNTER — Other Ambulatory Visit (HOSPITAL_COMMUNITY): Payer: Self-pay

## 2021-05-03 ENCOUNTER — Other Ambulatory Visit (HOSPITAL_COMMUNITY): Payer: Self-pay

## 2021-06-12 ENCOUNTER — Telehealth: Payer: Self-pay

## 2021-06-12 NOTE — Telephone Encounter (Signed)
I have asked her to follow up for more refills. Not refilling it until then

## 2021-06-12 NOTE — Telephone Encounter (Signed)
MEDICATION: Liraglutide -Weight Management (SAXENDA) 18 MG/3ML SOPN  PHARMACY: [facility]  Comments: Patient is completely out.   **Let patient know to contact pharmacy at the end of the day to make sure medication is ready. **  ** Please notify patient to allow 48-72 hours to process**  **Encourage patient to contact the pharmacy for refills or they can request refills through [facility]**

## 2021-06-13 NOTE — Telephone Encounter (Signed)
Patient advised. Due to work schedule and provider been out of the office after 06/14/21 soonest appointment was on 06/20/21. Patient advised we would address refill request at that time.

## 2021-06-20 ENCOUNTER — Encounter: Payer: Self-pay | Admitting: Nurse Practitioner

## 2021-06-20 ENCOUNTER — Ambulatory Visit (INDEPENDENT_AMBULATORY_CARE_PROVIDER_SITE_OTHER): Payer: No Typology Code available for payment source | Admitting: Nurse Practitioner

## 2021-06-20 ENCOUNTER — Other Ambulatory Visit (HOSPITAL_COMMUNITY): Payer: Self-pay

## 2021-06-20 VITALS — BP 120/78 | HR 110 | Temp 98.7°F | Ht 62.0 in | Wt 186.0 lb

## 2021-06-20 DIAGNOSIS — Z1231 Encounter for screening mammogram for malignant neoplasm of breast: Secondary | ICD-10-CM

## 2021-06-20 DIAGNOSIS — I10 Essential (primary) hypertension: Secondary | ICD-10-CM | POA: Diagnosis not present

## 2021-06-20 DIAGNOSIS — E6609 Other obesity due to excess calories: Secondary | ICD-10-CM | POA: Diagnosis not present

## 2021-06-20 DIAGNOSIS — Z1211 Encounter for screening for malignant neoplasm of colon: Secondary | ICD-10-CM

## 2021-06-20 DIAGNOSIS — Z6834 Body mass index (BMI) 34.0-34.9, adult: Secondary | ICD-10-CM

## 2021-06-20 MED ORDER — SAXENDA 18 MG/3ML ~~LOC~~ SOPN
3.0000 mg | PEN_INJECTOR | Freq: Every day | SUBCUTANEOUS | 0 refills | Status: DC
  Filled 2021-06-20 – 2021-06-27 (×3): qty 15, 30d supply, fill #0

## 2021-06-20 NOTE — Progress Notes (Signed)
Established Patient Office Visit  Subjective   Patient ID: REMONA BOOM, female    DOB: 23-Apr-1975  Age: 46 y.o. MRN: [id number]  Chief Complaint  Patient presents with   Follow-up    On saxenda 18 mg/74m; needs refills     HPI  Weight loss:  Diet: states that she is eating once or twice a week with light meals. Not much snacking but can and depends on the time States that she is drinking mainly water with some Diet Dr. PMalachi Bonds Exercise: every other day at 30-40 min    Review of Systems  Constitutional:  Negative for chills and fever.  Respiratory:  Negative for shortness of breath.   Cardiovascular:  Negative for chest pain.  Gastrointestinal:  Negative for abdominal pain, constipation, diarrhea, nausea and vomiting.      Objective:     BP 120/78 (BP Location: Left Arm, Patient Position: Sitting, Cuff Size: Normal)   Pulse (!) 110   Temp 98.7 F (37.1 C) (Temporal)   Ht '5\' 2"'$  (1.575 m)   Wt 186 lb (84.4 kg)   SpO2 98%   BMI 34.02 kg/m  BP Readings from Last 3 Encounters:  06/20/21 120/78  11/23/20 132/88  01/06/19 (!) 133/98   Wt Readings from Last 3 Encounters:  06/20/21 186 lb (84.4 kg)  11/23/20 207 lb 2 oz (94 kg)  01/06/19 179 lb 3.7 oz (81.3 kg)      Physical Exam Vitals and nursing note reviewed.  Constitutional:      Appearance: Normal appearance.  Cardiovascular:     Rate and Rhythm: Regular rhythm. Tachycardia present.     Heart sounds: Normal heart sounds.  Pulmonary:     Effort: Pulmonary effort is normal.     Breath sounds: Normal breath sounds.  Abdominal:     General: Bowel sounds are normal. There is no distension.     Palpations: There is no mass.     Tenderness: There is no abdominal tenderness.     Hernia: No hernia is present.  Lymphadenopathy:     Cervical: No cervical adenopathy.  Neurological:     Mental Status: She is alert.      No results found for any visits on 06/20/21.    The 10-year ASCVD risk score  (Arnett DK, et al., 2019) is: 0.8%    Assessment & Plan:   Problem List Items Addressed This Visit       Cardiovascular and Mediastinum   Hypertension    Patient's blood pressure within normal limit.  She has lost approximately 20 pounds using Saxenda.  Continue working on weight loss and management continue monitoring blood pressure.        Other   Class 1 obesity due to excess calories without serious comorbidity with body mass index (BMI) of 34.0 to 34.9 in adult - Primary    Patient tolerating Saxenda medication well.  Continue using medication pending labs today patient denies any adverse drug events such as palpitations, lump/bump in throat, diarrhea/constipation.  Continue Saxenda 3 mg subcu daily      Relevant Medications   Liraglutide -Weight Management (SAXENDA) 18 MG/3ML SOPN   Other Relevant Orders   CBC   Comprehensive metabolic panel   Other Visit Diagnoses     Screening for colon cancer       Relevant Orders   Ambulatory referral to Gastroenterology   Encounter for screening mammogram for malignant neoplasm of breast       Relevant Orders  MM Digital Screening       Return in about 6 months (around 12/20/2021) for cpe and labs.    Romilda Garret, NP

## 2021-06-20 NOTE — Patient Instructions (Signed)
Nice to see you today I will be in touch with the labs I will need to see you again in 6 months for your physical

## 2021-06-20 NOTE — Assessment & Plan Note (Signed)
Patient tolerating Saxenda medication well.  Continue using medication pending labs today patient denies any adverse drug events such as palpitations, lump/bump in throat, diarrhea/constipation.  Continue Saxenda 3 mg subcu daily

## 2021-06-20 NOTE — Assessment & Plan Note (Signed)
Patient's blood pressure within normal limit.  She has lost approximately 20 pounds using Saxenda.  Continue working on weight loss and management continue monitoring blood pressure.

## 2021-06-21 NOTE — Telephone Encounter (Signed)
Prior auth started for Saxenda '18MG'$ /3ML pen-injectors. Rebekah Irwin (KeyLanier Prude) Waiting for determination.

## 2021-06-23 ENCOUNTER — Other Ambulatory Visit: Payer: No Typology Code available for payment source

## 2021-06-23 ENCOUNTER — Other Ambulatory Visit (HOSPITAL_COMMUNITY): Payer: Self-pay

## 2021-06-23 NOTE — Telephone Encounter (Signed)
Rebekah Irwin has been approved for Saxenda '18MG'$ /3ML pen-injectors. Rebekah Irwin (Key: Lanier Prude)  This letter is to notify you that your Rebekah authorization request for [place] 18 MG/3 ML  PEN has been approved based upon the information we received from your healthcare  practitioner.  Benefits for all services are subject to terms, conditions and eligibility as outlined in the  benefit documentation in effect at the time services are provided. The authorization is effective for a maximum of 12 fill(s) from 06/22/2021 to 06/22/2022, as  long as you are enrolled as a member of your current health plan. The request was approved as submitted. This request is approved for 0.53m per day.  Approval letter sent to scanning.

## 2021-06-26 ENCOUNTER — Encounter: Payer: No Typology Code available for payment source | Admitting: Nurse Practitioner

## 2021-06-27 ENCOUNTER — Other Ambulatory Visit (HOSPITAL_COMMUNITY): Payer: Self-pay

## 2021-06-29 ENCOUNTER — Other Ambulatory Visit: Payer: No Typology Code available for payment source

## 2021-07-03 ENCOUNTER — Other Ambulatory Visit: Payer: No Typology Code available for payment source

## 2021-08-03 ENCOUNTER — Other Ambulatory Visit: Payer: Self-pay | Admitting: Nurse Practitioner

## 2021-08-03 ENCOUNTER — Other Ambulatory Visit (HOSPITAL_COMMUNITY): Payer: Self-pay

## 2021-08-03 DIAGNOSIS — E6609 Other obesity due to excess calories: Secondary | ICD-10-CM

## 2021-08-04 IMAGING — US US EXTREM LOW*R* LIMITED
1 series · 14 of 22 positions shown · non-contrast
Comparison: None.

CLINICAL DATA: Mass back of calf.  Question cyst.

EXAM:
ULTRASOUND RIGHT LOWER EXTREMITY LIMITED
TECHNIQUE: Ultrasound examination of the lower extremity soft tissues was
performed in the area of clinical concern.

[Series 1: us extrem low*right* limited · 0.05mm/px · 22 acquisitions, 14 frames shown]
[im 1/22]
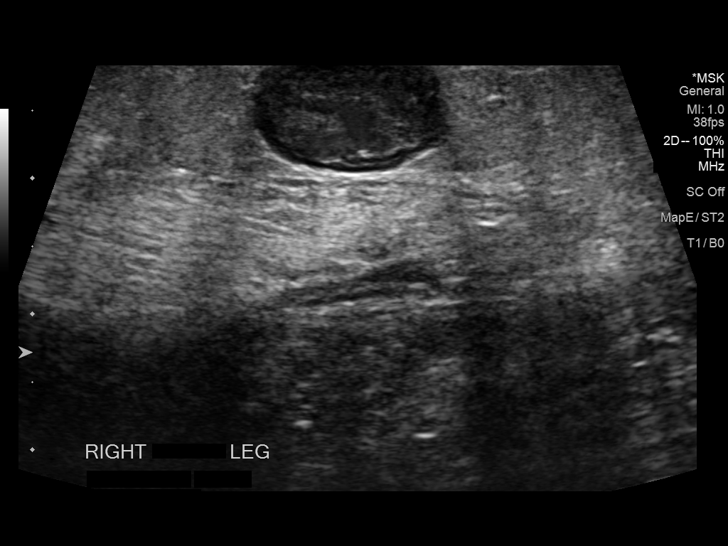
[im 3/22]
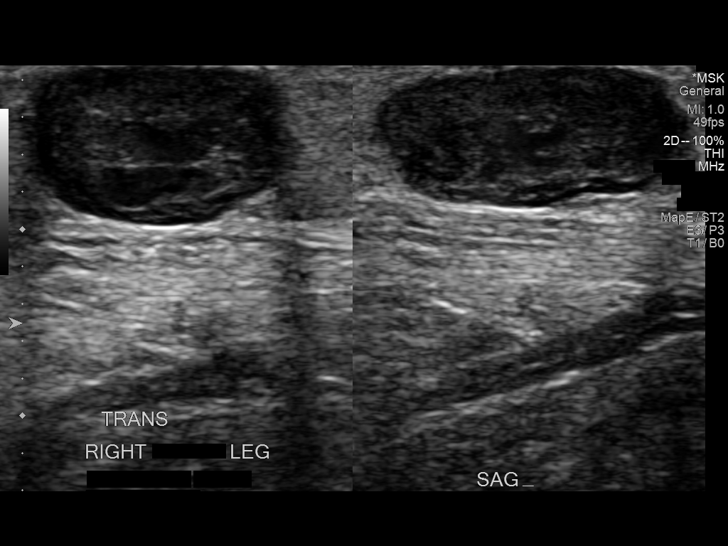
[im 4/22]
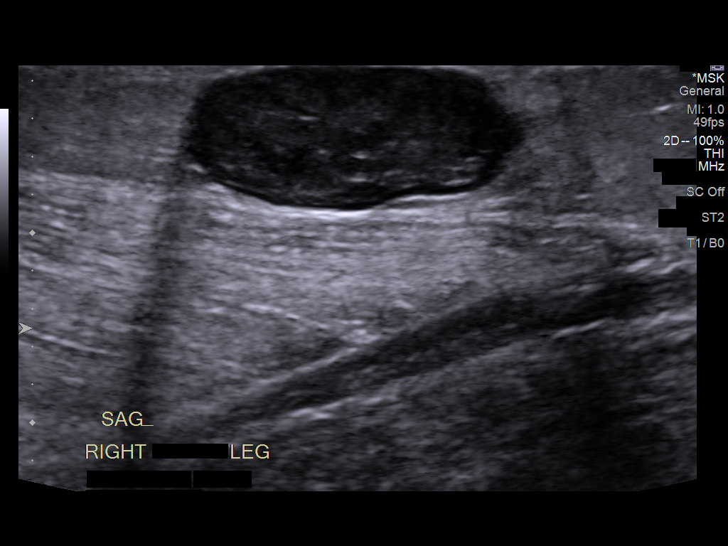
[im 6/22]
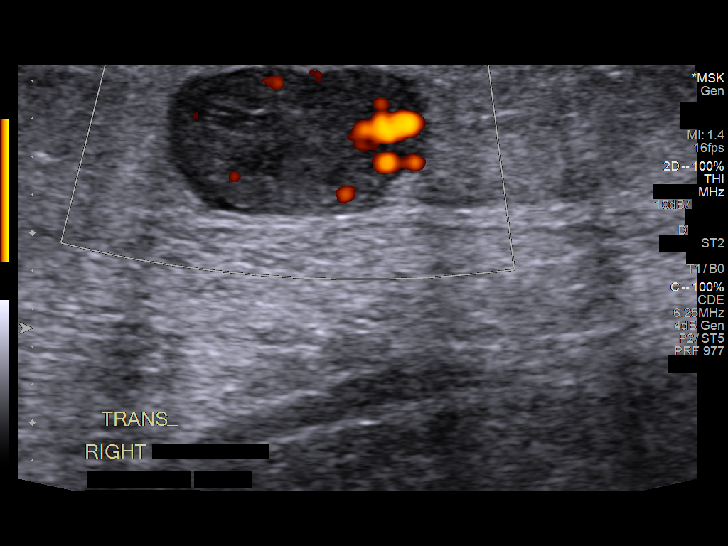
[im 8/22]
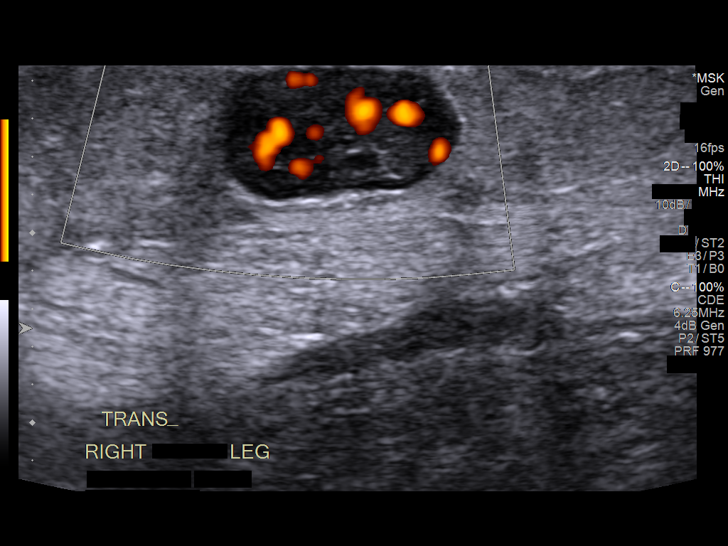
[im 9/22]
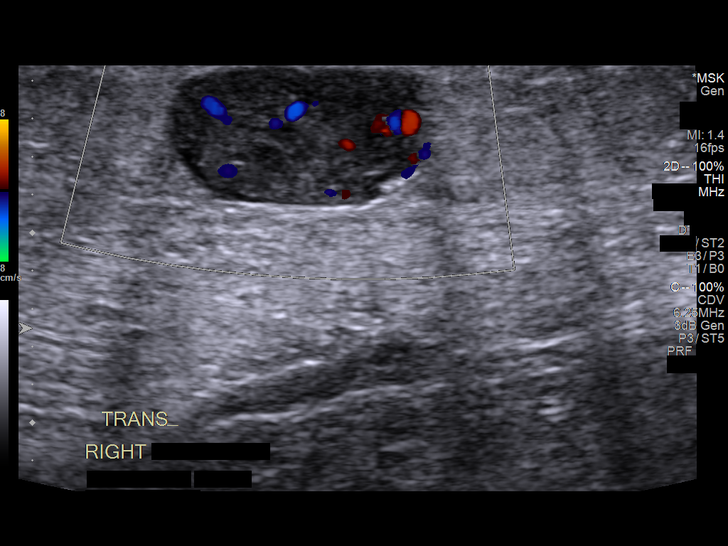
[im 11/22]
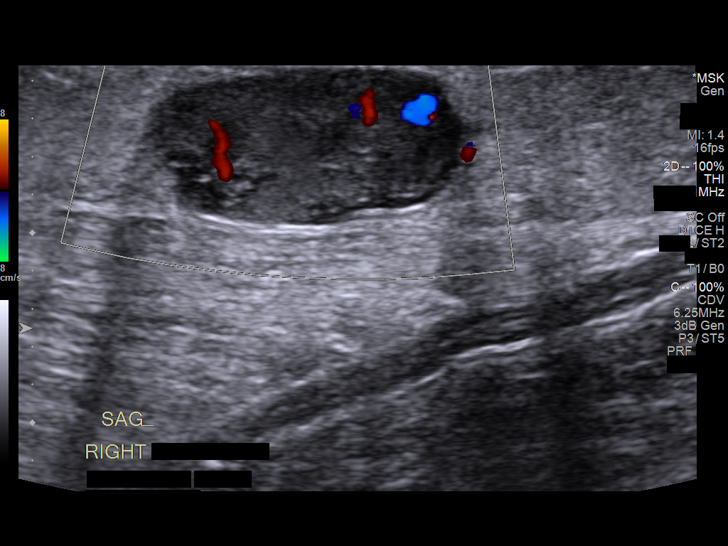
[im 12/22]
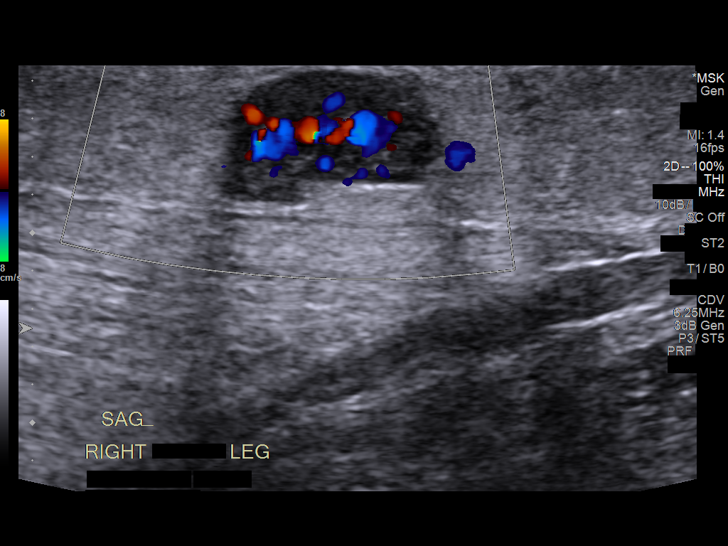
[im 14/22]
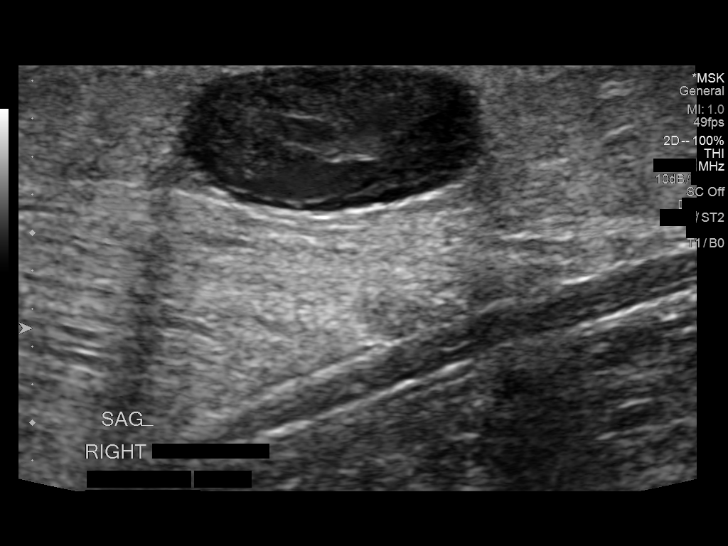
[im 15/22]
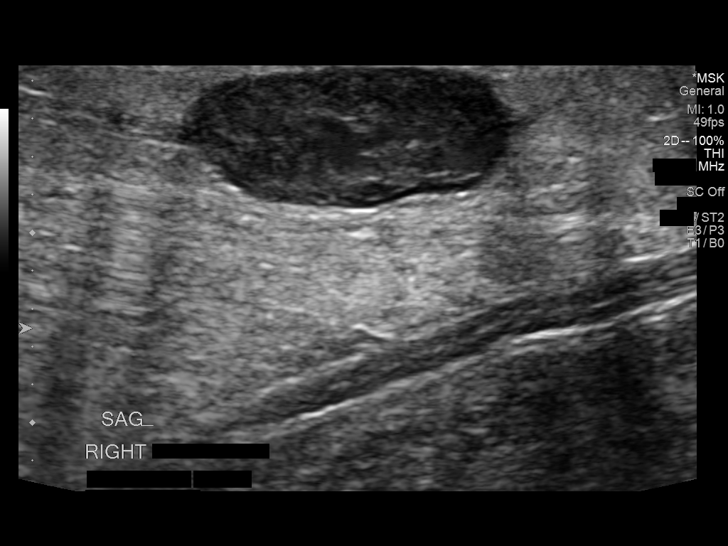
[im 17/22]
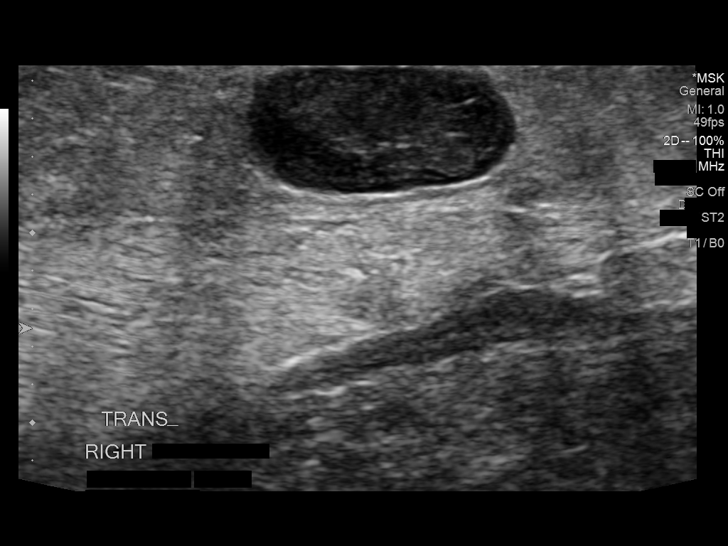
[im 19/22]
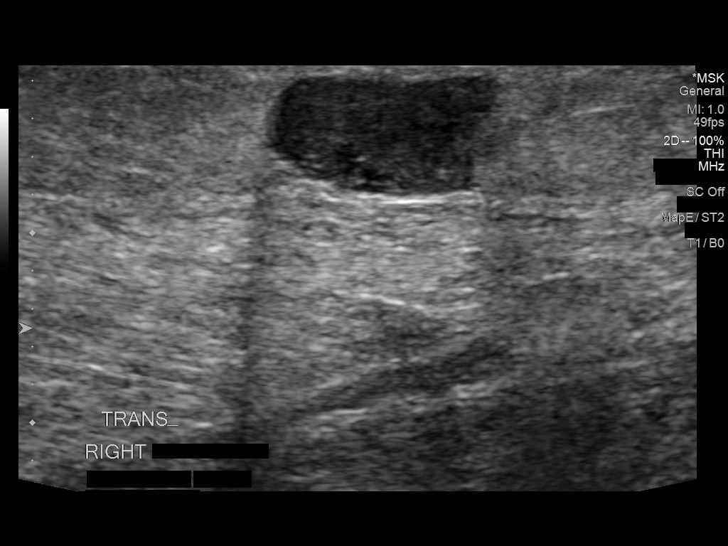
[im 20/22]
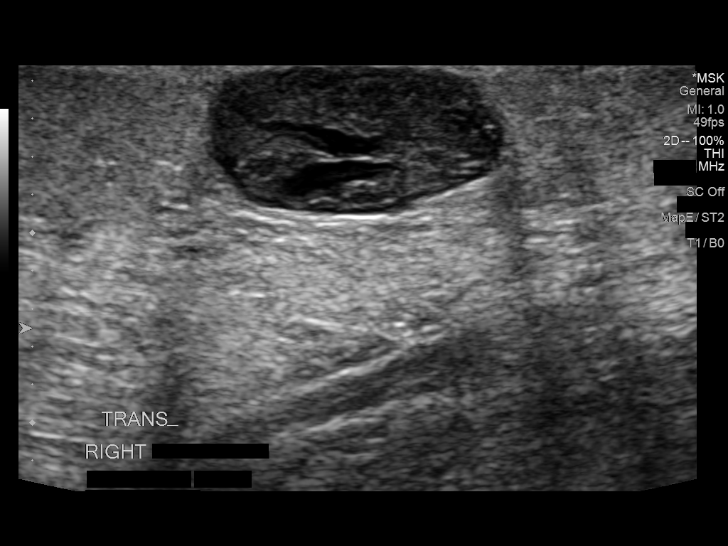
[im 22/22]
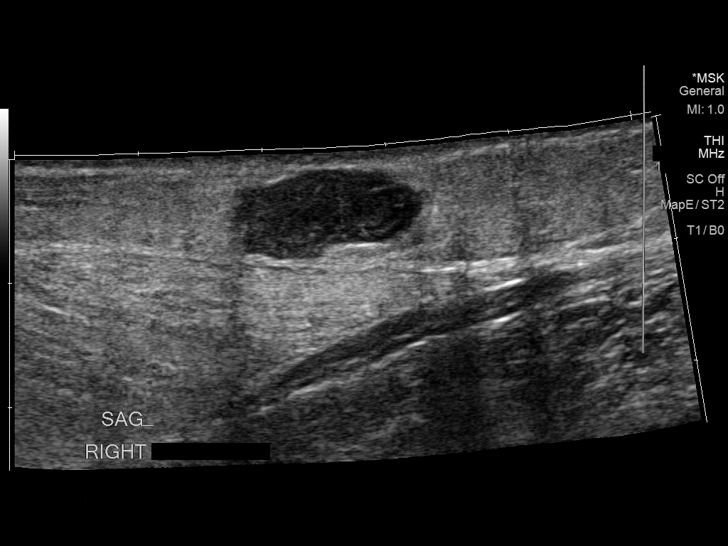

[14 of 22 positions shown; findings below may reference images not displayed]

FINDINGS: Joint Space: Not applicable.

Muscles: Normal.

Tendons: Not applicable.

Other Soft Tissue Structures: Within the subcutaneous tissues of the
right lower leg in the palpable area medially is a 1.4 x 0.9 x
cm ovoid lesion. Lesion is minimally heterogeneous and has internal
blood flow. This appears deep to the dermis.
IMPRESSION: Complex soft tissue mass in the area of clinical concern measuring
1.4 x 0.9 x 1.6 cm. There is internal blood flow. This does not
represent a lipoma or cyst given internal vascularity, and has
nonspecific sonographic features. Recommend further evaluation with
MRI.

## 2021-08-04 NOTE — Telephone Encounter (Signed)
Tried calling patient to discuss rescheduling missed lab appointment but call would not go through, sent mychart message

## 2021-08-07 ENCOUNTER — Other Ambulatory Visit: Payer: Self-pay | Admitting: Nurse Practitioner

## 2021-08-07 ENCOUNTER — Other Ambulatory Visit (HOSPITAL_COMMUNITY): Payer: Self-pay

## 2021-08-07 DIAGNOSIS — E6609 Other obesity due to excess calories: Secondary | ICD-10-CM

## 2021-08-07 NOTE — Telephone Encounter (Signed)
The point of the office visit is to see how she is doing and get the labs. I need the labs first

## 2021-08-08 ENCOUNTER — Other Ambulatory Visit (HOSPITAL_COMMUNITY): Payer: Self-pay

## 2021-08-11 ENCOUNTER — Other Ambulatory Visit (HOSPITAL_COMMUNITY): Payer: Self-pay

## 2021-08-11 ENCOUNTER — Other Ambulatory Visit (INDEPENDENT_AMBULATORY_CARE_PROVIDER_SITE_OTHER): Payer: No Typology Code available for payment source

## 2021-08-11 ENCOUNTER — Other Ambulatory Visit: Payer: Self-pay | Admitting: Nurse Practitioner

## 2021-08-11 DIAGNOSIS — E6609 Other obesity due to excess calories: Secondary | ICD-10-CM

## 2021-08-11 DIAGNOSIS — Z6834 Body mass index (BMI) 34.0-34.9, adult: Secondary | ICD-10-CM

## 2021-08-11 LAB — CBC
HCT: 36.1 % (ref 36.0–46.0)
Hemoglobin: 12 g/dL (ref 12.0–15.0)
MCHC: 33.3 g/dL (ref 30.0–36.0)
MCV: 84.4 fl (ref 78.0–100.0)
Platelets: 334 10*3/uL (ref 150.0–400.0)
RBC: 4.28 Mil/uL (ref 3.87–5.11)
RDW: 13.5 % (ref 11.5–15.5)
WBC: 4.5 10*3/uL (ref 4.0–10.5)

## 2021-08-11 LAB — COMPREHENSIVE METABOLIC PANEL
ALT: 14 U/L (ref 0–35)
AST: 15 U/L (ref 0–37)
Albumin: 4 g/dL (ref 3.5–5.2)
Alkaline Phosphatase: 82 U/L (ref 39–117)
BUN: 11 mg/dL (ref 6–23)
CO2: 28 mEq/L (ref 19–32)
Calcium: 9.2 mg/dL (ref 8.4–10.5)
Chloride: 107 mEq/L (ref 96–112)
Creatinine, Ser: 0.99 mg/dL (ref 0.40–1.20)
GFR: 68.47 mL/min (ref >60.00)
Glucose, Bld: 79 mg/dL (ref 70–99)
Potassium: 4 mEq/L (ref 3.5–5.1)
Sodium: 141 mEq/L (ref 135–145)
Total Bilirubin: 0.5 mg/dL (ref 0.2–1.2)
Total Protein: 7.4 g/dL (ref 6.0–8.3)

## 2021-08-11 IMAGING — MR MR [PERSON_NAME] LOW W/O CM*R*
3 of 6 series · 8 of 40 positions shown · non-contrast
Comparison: Ultrasound dated 12/03/2018

CLINICAL DATA: Soft tissue mass of the right lower leg.

EXAM:
MRI OF LOWER RIGHT EXTREMITY WITHOUT CONTRAST
TECHNIQUE: Multiplanar, multisequence MR imaging of the right lower leg was
performed. No intravenous contrast was administered.

[Series 3: T1 · axial · right · 6.0mm · 0.29mm/px · z∈[-56,+162]mm · 3 of 35 slices shown (1 of 2)]
[im 7/35]
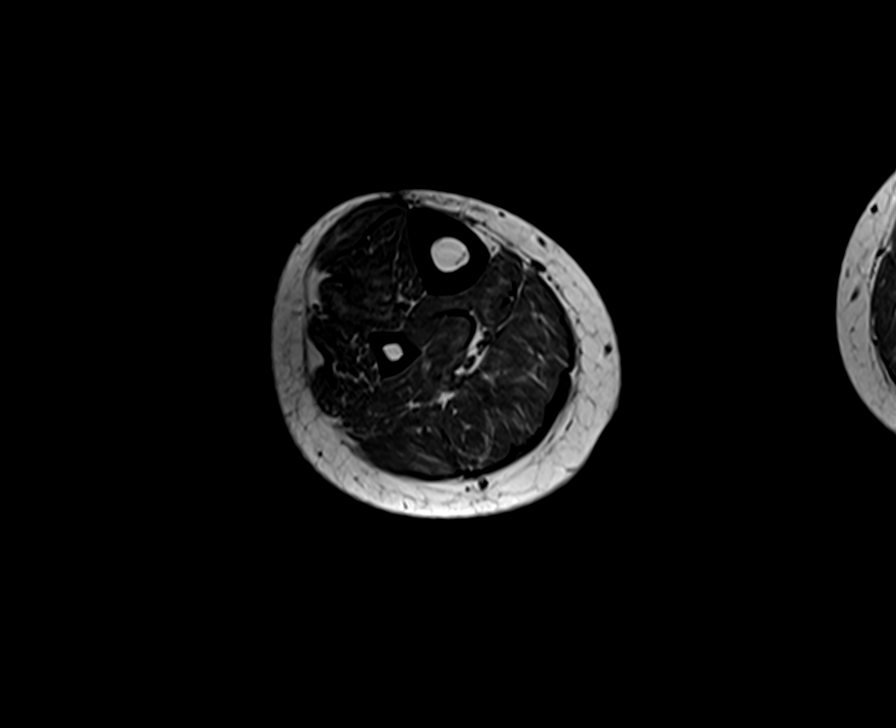
[im 21/35]
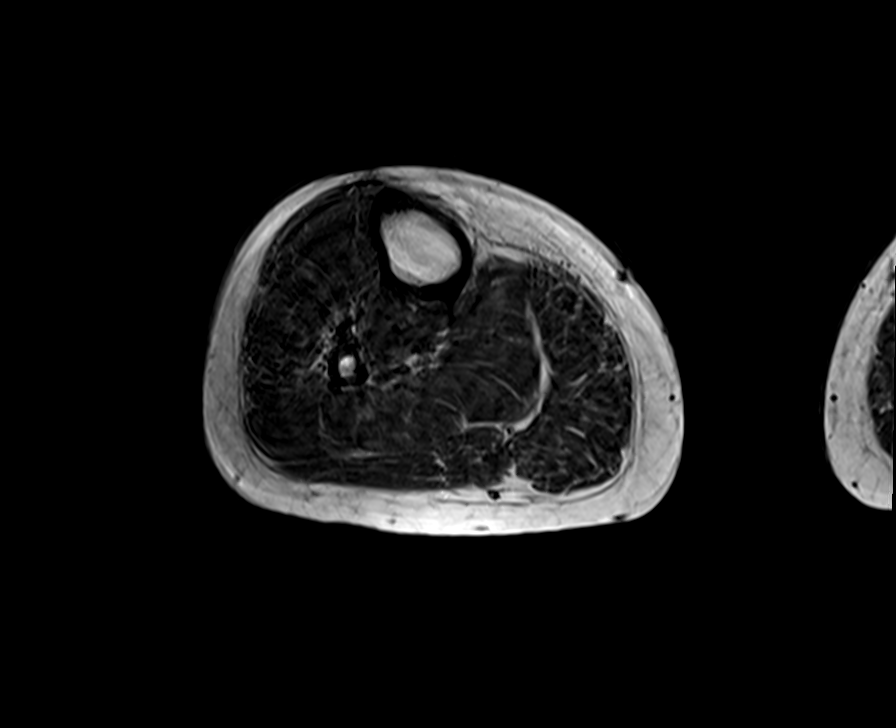
[im 35/35]
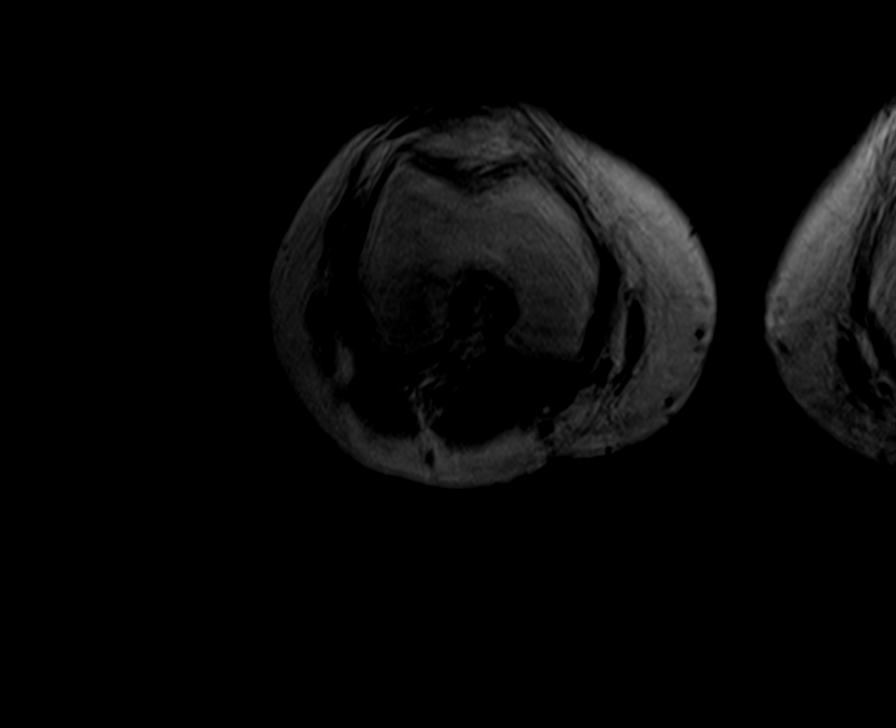

[Series 4: T2 fat-sat · axial · right · 6.0mm · 0.29mm/px · z∈[-64,+116]mm · 3 of 35 slices shown]
[im 6/35]
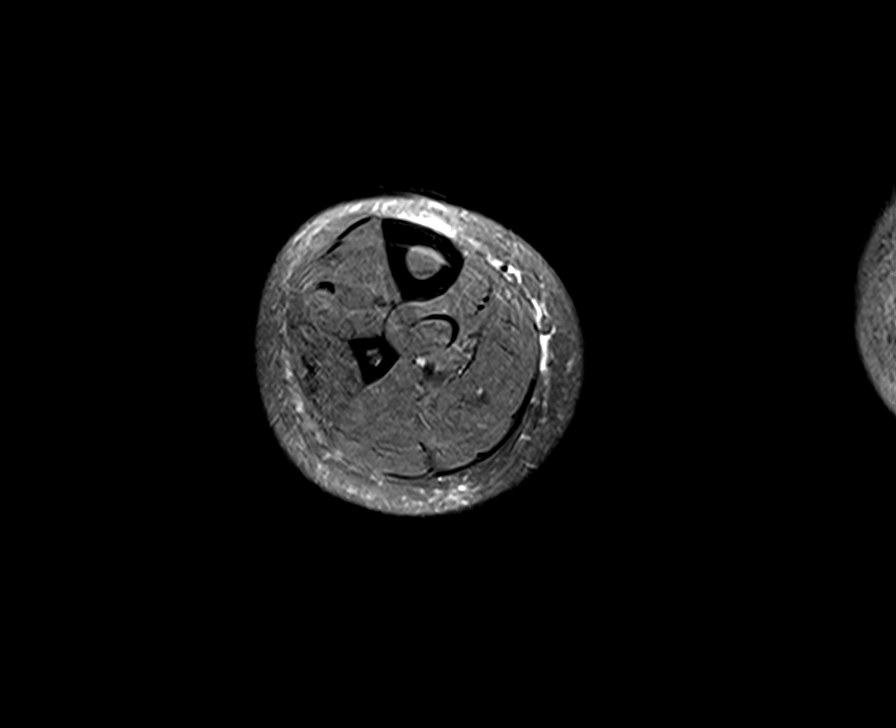
[im 18/35]
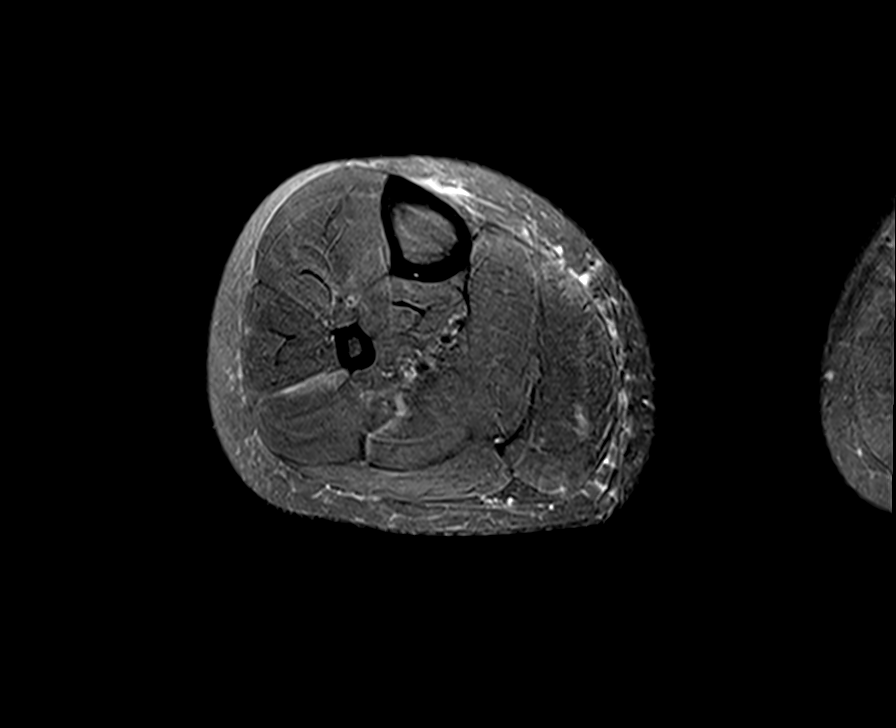
[im 29/35]
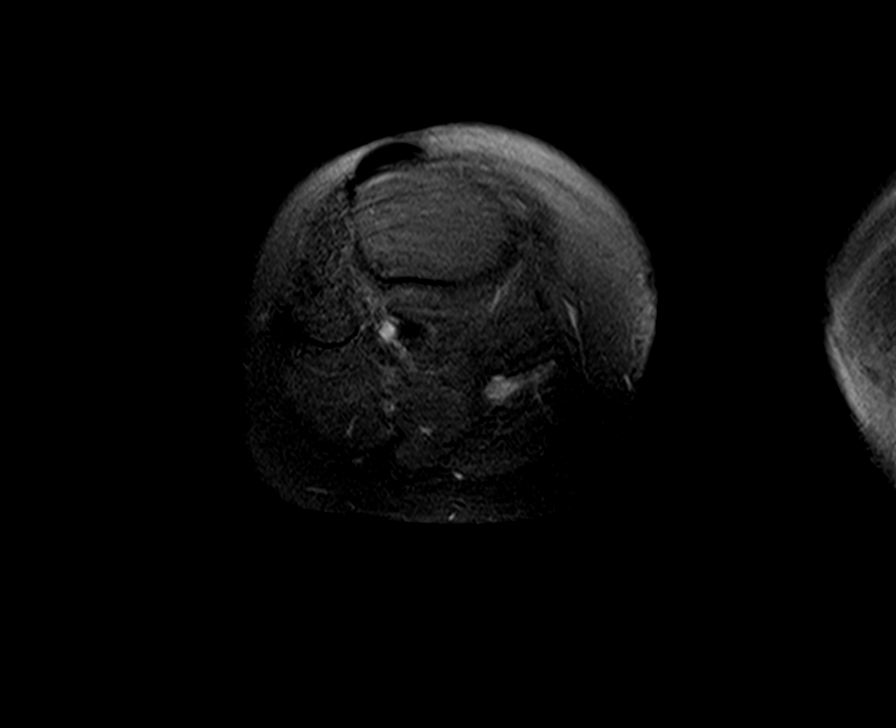

[Series 5: T1 · axial · right · 6.0mm · 0.29mm/px · z∈[-64,+30]mm · 2 of 35 slices shown (2 of 2)]
[im 6/35]
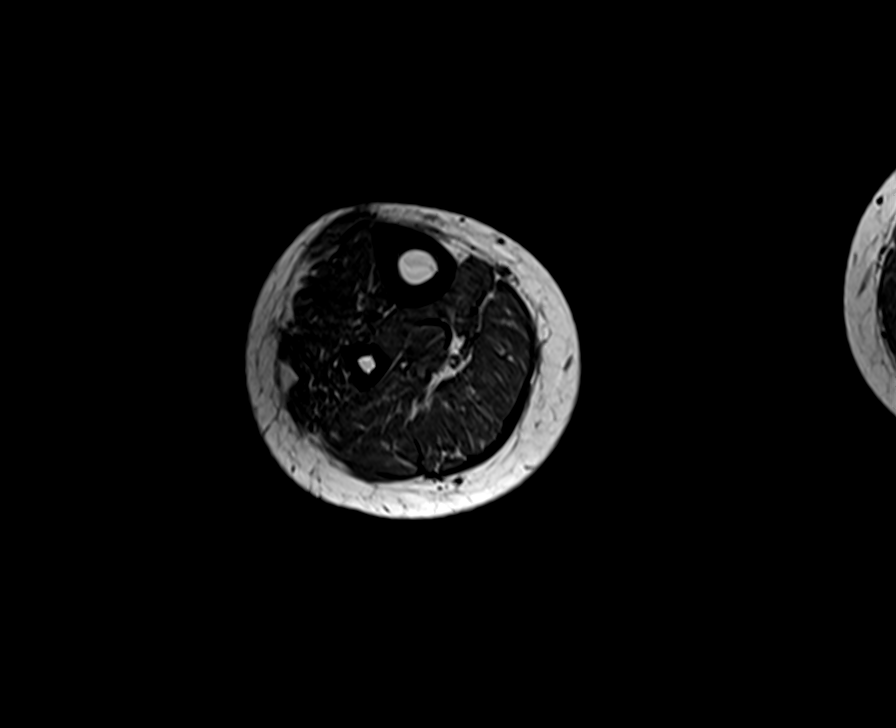
[im 18/35]
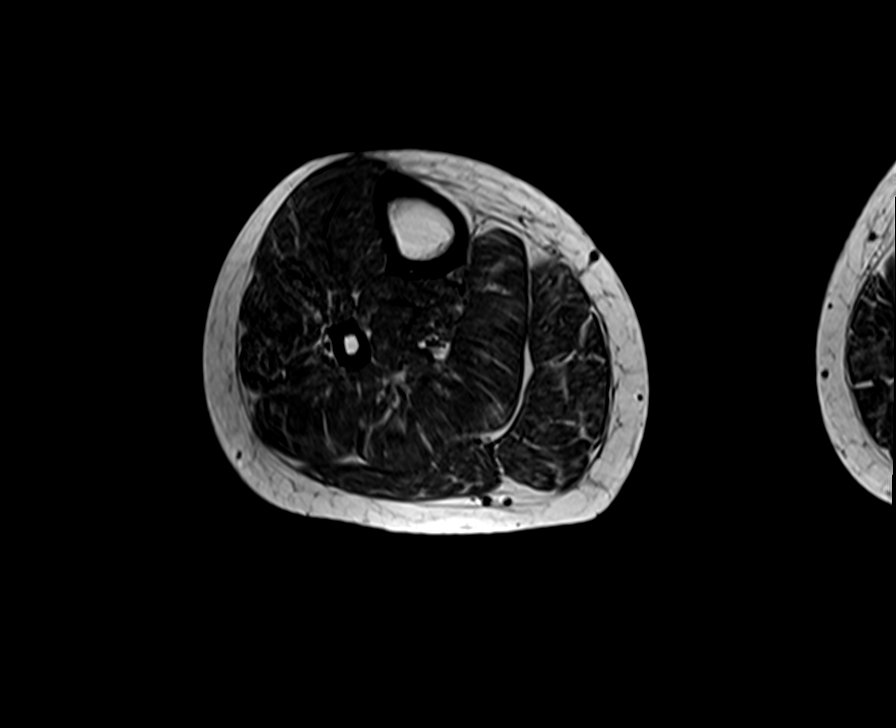

[8 of 40 positions shown; findings below may reference images not displayed]

FINDINGS: Bones/Joint/Cartilage

There is no significant abnormality of the visualized portions of
the right tibia and fibula. Slight arthritic changes in the right
knee.

Muscles and Tendons

Normal.

Soft tissues

There is a well-defined 19 mm nodule in the subcutaneous fat at the
posterior aspect of the mid right calf. The lesion is
well-marginated. It is intermediate signal intensity on T1 weighted
imaging with a low signal intensity rim. The lesion is
inhomogeneously high signal intensity on T2 weighted imaging.
Combining these findings with the ultrasound exam,, the findings are
consistent with a neoplasm.
IMPRESSION: 19 mm soft tissue mass in the subcutaneous fat at the posterior
aspect of the mid right calf. The appearance is consistent with a
neoplasm. The differential diagnosis of this appearance includes
benign and malignant lesions. Complete excisional biopsy
recommended.

## 2021-08-14 ENCOUNTER — Other Ambulatory Visit (HOSPITAL_COMMUNITY): Payer: Self-pay

## 2021-08-14 ENCOUNTER — Other Ambulatory Visit: Payer: Self-pay | Admitting: Nurse Practitioner

## 2021-08-14 DIAGNOSIS — E6609 Other obesity due to excess calories: Secondary | ICD-10-CM

## 2021-08-14 MED ORDER — SAXENDA 18 MG/3ML ~~LOC~~ SOPN
3.0000 mg | PEN_INJECTOR | Freq: Every day | SUBCUTANEOUS | 3 refills | Status: DC
  Filled 2021-08-14 – 2021-08-28 (×3): qty 15, 30d supply, fill #0
  Filled 2021-10-06: qty 15, 30d supply, fill #1
  Filled 2021-11-15 – 2021-12-04 (×2): qty 15, 30d supply, fill #2
  Filled 2022-01-09: qty 15, 30d supply, fill #3

## 2021-08-15 ENCOUNTER — Other Ambulatory Visit (HOSPITAL_COMMUNITY): Payer: Self-pay

## 2021-08-28 ENCOUNTER — Other Ambulatory Visit (HOSPITAL_COMMUNITY): Payer: Self-pay

## 2021-10-06 ENCOUNTER — Other Ambulatory Visit (HOSPITAL_COMMUNITY): Payer: Self-pay

## 2021-11-15 ENCOUNTER — Other Ambulatory Visit (HOSPITAL_COMMUNITY): Payer: Self-pay

## 2021-11-28 ENCOUNTER — Other Ambulatory Visit (HOSPITAL_COMMUNITY): Payer: Self-pay

## 2021-12-04 ENCOUNTER — Other Ambulatory Visit (HOSPITAL_COMMUNITY): Payer: Self-pay

## 2021-12-05 ENCOUNTER — Other Ambulatory Visit (HOSPITAL_COMMUNITY): Payer: Self-pay

## 2022-01-09 ENCOUNTER — Other Ambulatory Visit (HOSPITAL_COMMUNITY): Payer: Self-pay

## 2022-01-10 ENCOUNTER — Telehealth: Payer: Self-pay | Admitting: Nurse Practitioner

## 2022-01-10 ENCOUNTER — Other Ambulatory Visit (HOSPITAL_COMMUNITY): Payer: Self-pay

## 2022-01-10 NOTE — Telephone Encounter (Signed)
Patient called in and stated that the Liraglutide -Weight Management (SAXENDA) 18 MG/3ML SOPN needs a new prior authorization.

## 2022-01-11 ENCOUNTER — Telehealth: Payer: Self-pay | Admitting: Nurse Practitioner

## 2022-01-11 ENCOUNTER — Encounter: Payer: Self-pay | Admitting: Nurse Practitioner

## 2022-01-11 ENCOUNTER — Other Ambulatory Visit (HOSPITAL_COMMUNITY): Payer: Self-pay

## 2022-01-11 NOTE — Telephone Encounter (Signed)
Can we clarify that she needs a script or just needs a prior auth??

## 2022-01-11 NOTE — Telephone Encounter (Addendum)
Ran test claim for Saxenda. Came back refill too soon. Pharmacy received paid claim, no PA needed at this time.

## 2022-01-11 NOTE — Telephone Encounter (Signed)
Patient is scheduled for 01/31/22 to come in for her physical. In the mean time she would like to know is there any way 1 refill can be sent in for her until she makes this appointment. She said that she's out and she doesn't want to have to start all over again with the nausea.

## 2022-01-11 NOTE — Telephone Encounter (Signed)
error 

## 2022-01-11 NOTE — Telephone Encounter (Signed)
Per pharmacy patient has a refill available for pick up, co-pay is $125 as coupon cards are no longer available. Pharmacy also states Kirke Shaggy will be discontinued in the next few months. They recommend switching patients over to [facility] at the 1.'7mg'$  or 2.'4mg'$ , depending on which would suit patient better.   Attempted to contact patient to advise her of available refill and possible co-pay change. Phone number in message and chart has recording that says it is no longer active.   MyChart message sent to patient to advise on refill/co-pay and to request current phone number.   Please advise on potential change in medication. Thank you!

## 2022-01-11 NOTE — Telephone Encounter (Signed)
Mychart message sent to patient to contact the office and schedule appointment.

## 2022-01-11 NOTE — Telephone Encounter (Signed)
Called number listed but stating it is out of service.

## 2022-01-11 NOTE — Telephone Encounter (Signed)
Prescription Request  01/11/2022  Is this a "Controlled Substance" medicine? No  LOV: 06/20/21  What is the name of the medication or equipment? Liraglutide -Weight Management (SAXENDA) 18 MG/3ML SOPN   Have you contacted your pharmacy to request a refill? Yes   Which pharmacy would you like this sent to?  [facility] [address] [place] [address] Phone: [phone] Fax: [phone]    Patient notified that their request is being sent to the clinical staff for review and that they should receive a response within 2 business days.   Please advise at Mobile [phone] (mobile)

## 2022-01-12 ENCOUNTER — Other Ambulatory Visit (HOSPITAL_COMMUNITY): Payer: Self-pay

## 2022-01-15 NOTE — Telephone Encounter (Signed)
She can pick up the medication. If it is a cost issue she will need to let us know and we can discuss other treatments

## 2022-01-15 NOTE — Telephone Encounter (Signed)
Attempted to reach patient. Phone recording states "This number is no longer in service". Multiple MyChart messages have been sent to patient with no response. Closing encounter.

## 2022-01-31 ENCOUNTER — Other Ambulatory Visit (HOSPITAL_COMMUNITY): Payer: Self-pay

## 2022-01-31 ENCOUNTER — Ambulatory Visit (INDEPENDENT_AMBULATORY_CARE_PROVIDER_SITE_OTHER): Payer: 59 | Admitting: Nurse Practitioner

## 2022-01-31 ENCOUNTER — Encounter: Payer: Self-pay | Admitting: Nurse Practitioner

## 2022-01-31 VITALS — BP 122/82 | HR 59 | Ht 62.0 in | Wt 185.0 lb

## 2022-01-31 DIAGNOSIS — Z1231 Encounter for screening mammogram for malignant neoplasm of breast: Secondary | ICD-10-CM

## 2022-01-31 DIAGNOSIS — Z1211 Encounter for screening for malignant neoplasm of colon: Secondary | ICD-10-CM

## 2022-01-31 DIAGNOSIS — Z124 Encounter for screening for malignant neoplasm of cervix: Secondary | ICD-10-CM

## 2022-01-31 DIAGNOSIS — F419 Anxiety disorder, unspecified: Secondary | ICD-10-CM | POA: Diagnosis not present

## 2022-01-31 DIAGNOSIS — E669 Obesity, unspecified: Secondary | ICD-10-CM

## 2022-01-31 DIAGNOSIS — I10 Essential (primary) hypertension: Secondary | ICD-10-CM | POA: Diagnosis not present

## 2022-01-31 DIAGNOSIS — Z0000 Encounter for general adult medical examination without abnormal findings: Secondary | ICD-10-CM

## 2022-01-31 LAB — COMPREHENSIVE METABOLIC PANEL
ALT: 15 U/L (ref 0–35)
AST: 17 U/L (ref 0–37)
Albumin: 4.1 g/dL (ref 3.5–5.2)
Alkaline Phosphatase: 93 U/L (ref 39–117)
BUN: 12 mg/dL (ref 6–23)
CO2: 30 mEq/L (ref 19–32)
Calcium: 9.5 mg/dL (ref 8.4–10.5)
Chloride: 102 mEq/L (ref 96–112)
Creatinine, Ser: 0.79 mg/dL (ref 0.40–1.20)
GFR: 89.46 mL/min (ref >60.00)
Glucose, Bld: 58 mg/dL — ABNORMAL LOW (ref 70–99)
Potassium: 3.9 mEq/L (ref 3.5–5.1)
Sodium: 139 mEq/L (ref 135–145)
Total Bilirubin: 0.5 mg/dL (ref 0.2–1.2)
Total Protein: 7.7 g/dL (ref 6.0–8.3)

## 2022-01-31 LAB — CBC
HCT: 38.7 % (ref 36.0–46.0)
Hemoglobin: 13.2 g/dL (ref 12.0–15.0)
MCHC: 34 g/dL (ref 30.0–36.0)
MCV: 85 fl (ref 78.0–100.0)
Platelets: 356 10*3/uL (ref 150.0–400.0)
RBC: 4.56 Mil/uL (ref 3.87–5.11)
RDW: 12.8 % (ref 11.5–15.5)
WBC: 4.9 10*3/uL (ref 4.0–10.5)

## 2022-01-31 LAB — TSH: TSH: 3.56 u[IU]/mL (ref 0.35–5.50)

## 2022-01-31 LAB — HEMOGLOBIN A1C: Hgb A1c MFr Bld: 5.7 % (ref 4.6–6.5)

## 2022-01-31 LAB — LIPID PANEL
Cholesterol: 186 mg/dL (ref 0–200)
HDL: 66.1 mg/dL (ref >39.00)
LDL Cholesterol: 107 mg/dL — ABNORMAL HIGH (ref 0–99)
NonHDL: 119.93
Total CHOL/HDL Ratio: 3
Triglycerides: 66 mg/dL (ref 0.0–149.0)
VLDL: 13.2 mg/dL (ref 0.0–40.0)

## 2022-01-31 MED ORDER — HYDROXYZINE HCL 10 MG PO TABS
10.0000 mg | ORAL_TABLET | Freq: Three times a day (TID) | ORAL | 0 refills | Status: DC | PRN
  Filled 2022-01-31 – 2022-03-09 (×3): qty 30, 10d supply, fill #0

## 2022-01-31 NOTE — Assessment & Plan Note (Signed)
Discussed age-appropriate immunizations and screening exams.  Patient's Tdap up-to-date, flu vaccine up-to-date.  Ambulatory referral for CRC screening.  Ambulatory referral to GYN for Pap and IUD management.  Patient was given information about mammogram to call and get set up as ordered in system.  Patient was given information in regards to preventative healthcare maintenance with anticipatory guidance for age range.

## 2022-01-31 NOTE — Assessment & Plan Note (Signed)
Patient not currently on any antihypertensive.  This is being managed solely with diet and lifestyle modifications.

## 2022-01-31 NOTE — Assessment & Plan Note (Signed)
Patient is having intermittent anxiety.  She did request to try some medication to help out.  We will do hydroxyzine 10 mg 3 times daily as needed anxiety.  Sedation precautions reviewed she can reach out to me via MyChart as an update on therapy

## 2022-01-31 NOTE — Patient Instructions (Signed)
Nice to see you today I will be in touch with the labs once I have the results Follow up with me in 6 months, sooner if you need me 

## 2022-01-31 NOTE — Assessment & Plan Note (Signed)
Continue working on lifestyle modifications.  Patient has Plateaued in regards to weight loss she is currently on liraglutide and tolerating it well.

## 2022-01-31 NOTE — Progress Notes (Signed)
Established Patient Office Visit  Subjective   Patient ID: Rebekah Irwin, female    DOB: 04-Apr-1975  Age: 47 y.o. MRN: [id number]  Chief Complaint  Patient presents with   Annual Exam    HPI   HTN: Managed on diet and lifestyl modifications  for complete physical and follow up of chronic conditions.  Immunizations: -Tetanus: Completed in 2016 -Influenza: Completed  this season, patient works at [employer] and got it through employer -Shingles: too young -Pneumonia: too young  -HPV: aged out  Diet: [place]. 2 meals a day with the saxenda. Heavier lunch and lighter dinner. She is drinking mainly water and an energy drink every other day. She will also drink a diet Dr. Malachi Bonds Exercise: . Walks 15-20 mins every day   Eye exam: PRN Dental exam: Completes semi-annually   Pap Smear: Completed in 2017? Last visit GYN has been 3 years Mammogram: due, order in the computer. Patient was given information to call and get it scheduled at [facility]  Colonoscopy:  needs referral  [place] Lung Cancer Screening: does not qualify  Dexa: NA  Sleep: states sleeping pretty good. States bed time is 1030-6 and most time feels rested. Does snore lightly    Review of Systems  Constitutional:  Negative for chills and fever.  Respiratory:  Negative for shortness of breath.   Cardiovascular:  Negative for chest pain and leg swelling.  Gastrointestinal:  Negative for abdominal pain, blood in stool, constipation, diarrhea, nausea and vomiting.       BM every other day   Genitourinary:  Negative for dysuria and hematuria.  Neurological:  Negative for tingling and headaches.  Psychiatric/Behavioral:  Negative for hallucinations and suicidal ideas.       Objective:     BP 122/82   Pulse (!) 59   Ht '5\' 2"'$  (1.575 m)   Wt 185 lb (83.9 kg)   SpO2 99%   BMI 33.84 kg/m  BP Readings from Last 3 Encounters:  01/31/22 122/82  06/20/21 120/78  11/23/20 132/88   Wt  Readings from Last 3 Encounters:  01/31/22 185 lb (83.9 kg)  06/20/21 186 lb (84.4 kg)  11/23/20 207 lb 2 oz (94 kg)      Physical Exam Vitals and nursing note reviewed.  Constitutional:      Appearance: Normal appearance. She is obese.  HENT:     Right Ear: Tympanic membrane, ear canal and external ear normal.     Left Ear: Tympanic membrane, ear canal and external ear normal.     Mouth/Throat:     Mouth: Mucous membranes are moist.     Pharynx: Oropharynx is clear.  Eyes:     Extraocular Movements: Extraocular movements intact.     Pupils: Pupils are equal, round, and reactive to light.  Cardiovascular:     Rate and Rhythm: Normal rate and regular rhythm.     Pulses: Normal pulses.     Heart sounds: Normal heart sounds.  Pulmonary:     Effort: Pulmonary effort is normal.     Breath sounds: Normal breath sounds.  Abdominal:     General: Bowel sounds are normal. There is no distension.     Palpations: There is no mass.     Tenderness: There is no abdominal tenderness.     Hernia: No hernia is present.  Musculoskeletal:     Right lower leg: No edema.     Left lower leg: No edema.  Lymphadenopathy:  Cervical: No cervical adenopathy.  Skin:    General: Skin is warm.  Neurological:     General: No focal deficit present.     Mental Status: She is alert.     Deep Tendon Reflexes:     Reflex Scores:      Bicep reflexes are 2+ on the right side and 2+ on the left side.      Patellar reflexes are 2+ on the right side and 2+ on the left side.    Comments: Bilateral upper and lower extremity strength 5/5  Psychiatric:        Mood and Affect: Mood normal.        Behavior: Behavior normal.        Thought Content: Thought content normal.        Judgment: Judgment normal.      No results found for any visits on 01/31/22.    The 10-year ASCVD risk score (Arnett DK, et al., 2019) is: 0.8%    Assessment & Plan:   Problem List Items Addressed This Visit        Cardiovascular and Mediastinum   Hypertension    Patient not currently on any antihypertensive.  This is being managed solely with diet and lifestyle modifications.        Other   Obesity (BMI 30-39.9)    Continue working on lifestyle modifications.  Patient has Plateaued in regards to weight loss she is currently on liraglutide and tolerating it well.      Relevant Orders   Lipid panel   Hemoglobin A1c   Anxiety    Patient is having intermittent anxiety.  She did request to try some medication to help out.  We will do hydroxyzine 10 mg 3 times daily as needed anxiety.  Sedation precautions reviewed she can reach out to me via MyChart as an update on therapy      Relevant Medications   hydrOXYzine (ATARAX) 10 MG tablet   Other Relevant Orders   TSH   Preventative health care - Primary    Discussed age-appropriate immunizations and screening exams.  Patient's Tdap up-to-date, flu vaccine up-to-date.  Ambulatory referral for CRC screening.  Ambulatory referral to GYN for Pap and IUD management.  Patient was given information about mammogram to call and get set up as ordered in system.  Patient was given information in regards to preventative healthcare maintenance with anticipatory guidance for age range.      Relevant Orders   CBC   Lipid panel   Comprehensive metabolic panel   Hemoglobin A1c   TSH   Other Visit Diagnoses     Screening mammogram for breast cancer       Screening for colon cancer       Relevant Orders   Ambulatory referral to Gastroenterology   Cervical cancer screening       Relevant Orders   Ambulatory referral to Gynecology       Return in about 6 months (around 08/01/2022) for Weight and medication recheck .    Romilda Garret, NP

## 2022-02-08 ENCOUNTER — Other Ambulatory Visit (HOSPITAL_COMMUNITY): Payer: Self-pay

## 2022-02-13 ENCOUNTER — Other Ambulatory Visit: Payer: Self-pay

## 2022-02-13 ENCOUNTER — Other Ambulatory Visit: Payer: Self-pay | Admitting: Nurse Practitioner

## 2022-02-13 ENCOUNTER — Telehealth: Payer: Self-pay | Admitting: Nurse Practitioner

## 2022-02-13 ENCOUNTER — Other Ambulatory Visit (HOSPITAL_COMMUNITY): Payer: Self-pay

## 2022-02-13 DIAGNOSIS — E6609 Other obesity due to excess calories: Secondary | ICD-10-CM

## 2022-02-13 MED ORDER — SAXENDA 18 MG/3ML ~~LOC~~ SOPN
3.0000 mg | PEN_INJECTOR | Freq: Every day | SUBCUTANEOUS | 3 refills | Status: AC
  Filled 2022-02-13: qty 15, 30d supply, fill #0
  Filled 2022-04-13 – 2022-05-22 (×2): qty 15, 30d supply, fill #1

## 2022-02-13 NOTE — Telephone Encounter (Signed)
Have already refilled via refill requests

## 2022-02-13 NOTE — Telephone Encounter (Signed)
Prescription Request  02/13/2022  Is this a "Controlled Substance" medicine? Yes  LOV: 01/31/2022  What is the name of the medication or equipment? Liraglutide -Weight Management (SAXENDA) 18 MG/3ML SOPN   Have you contacted your pharmacy to request a refill? No   Which pharmacy would you like this sent to?  [facility] [address] [place] [address] Phone: [phone] Fax: [phone]    Patient notified that their request is being sent to the clinical staff for review and that they should receive a response within 2 business days.   Please advise at Mobile [phone] (mobile)

## 2022-02-14 ENCOUNTER — Other Ambulatory Visit (HOSPITAL_COMMUNITY): Payer: Self-pay

## 2022-03-01 ENCOUNTER — Other Ambulatory Visit (HOSPITAL_COMMUNITY): Payer: Self-pay

## 2022-03-01 ENCOUNTER — Other Ambulatory Visit: Payer: Self-pay

## 2022-03-05 ENCOUNTER — Encounter: Payer: Self-pay | Admitting: Nurse Practitioner

## 2022-03-06 ENCOUNTER — Other Ambulatory Visit (HOSPITAL_COMMUNITY): Payer: Self-pay

## 2022-03-09 ENCOUNTER — Other Ambulatory Visit (HOSPITAL_COMMUNITY): Payer: Self-pay

## 2022-03-20 ENCOUNTER — Other Ambulatory Visit: Payer: Self-pay | Admitting: Nurse Practitioner

## 2022-03-20 ENCOUNTER — Other Ambulatory Visit (HOSPITAL_COMMUNITY): Payer: Self-pay

## 2022-03-20 DIAGNOSIS — F419 Anxiety disorder, unspecified: Secondary | ICD-10-CM

## 2022-03-21 ENCOUNTER — Other Ambulatory Visit (HOSPITAL_COMMUNITY): Payer: Self-pay

## 2022-03-21 MED ORDER — HYDROXYZINE HCL 10 MG PO TABS
10.0000 mg | ORAL_TABLET | Freq: Three times a day (TID) | ORAL | 0 refills | Status: DC | PRN
  Filled 2022-03-21: qty 30, 10d supply, fill #0

## 2022-04-04 ENCOUNTER — Encounter: Payer: Self-pay | Admitting: Nurse Practitioner

## 2022-04-13 ENCOUNTER — Other Ambulatory Visit (HOSPITAL_COMMUNITY): Payer: Self-pay

## 2022-04-19 ENCOUNTER — Other Ambulatory Visit (HOSPITAL_COMMUNITY): Payer: Self-pay

## 2022-04-19 ENCOUNTER — Other Ambulatory Visit: Payer: Self-pay | Admitting: Nurse Practitioner

## 2022-04-19 DIAGNOSIS — F419 Anxiety disorder, unspecified: Secondary | ICD-10-CM

## 2022-04-19 MED ORDER — HYDROXYZINE HCL 10 MG PO TABS
10.0000 mg | ORAL_TABLET | Freq: Three times a day (TID) | ORAL | 0 refills | Status: DC | PRN
  Filled 2022-04-19 – 2022-05-02 (×2): qty 30, 10d supply, fill #0

## 2022-04-24 ENCOUNTER — Other Ambulatory Visit (HOSPITAL_COMMUNITY): Payer: Self-pay

## 2022-04-25 ENCOUNTER — Other Ambulatory Visit: Payer: Self-pay

## 2022-04-27 ENCOUNTER — Other Ambulatory Visit (HOSPITAL_COMMUNITY): Payer: Self-pay

## 2022-05-02 ENCOUNTER — Other Ambulatory Visit: Payer: Self-pay

## 2022-05-02 ENCOUNTER — Other Ambulatory Visit (HOSPITAL_COMMUNITY): Payer: Self-pay

## 2022-05-03 ENCOUNTER — Other Ambulatory Visit (HOSPITAL_COMMUNITY): Payer: Self-pay

## 2022-05-04 ENCOUNTER — Encounter: Payer: Self-pay | Admitting: Nurse Practitioner

## 2022-05-04 ENCOUNTER — Other Ambulatory Visit (HOSPITAL_COMMUNITY): Payer: Self-pay

## 2022-05-04 DIAGNOSIS — F419 Anxiety disorder, unspecified: Secondary | ICD-10-CM

## 2022-05-04 MED ORDER — HYDROXYZINE HCL 10 MG PO TABS
10.0000 mg | ORAL_TABLET | Freq: Three times a day (TID) | ORAL | 1 refills | Status: DC | PRN
  Filled 2022-05-04 – 2022-05-28 (×3): qty 90, 30d supply, fill #0
  Filled 2022-07-18 – 2022-07-21 (×2): qty 90, 30d supply, fill #1

## 2022-05-22 ENCOUNTER — Other Ambulatory Visit (HOSPITAL_COMMUNITY): Payer: Self-pay

## 2022-05-25 ENCOUNTER — Other Ambulatory Visit (HOSPITAL_COMMUNITY): Payer: Self-pay

## 2022-05-28 ENCOUNTER — Other Ambulatory Visit (HOSPITAL_COMMUNITY): Payer: Self-pay

## 2022-05-29 ENCOUNTER — Other Ambulatory Visit (HOSPITAL_COMMUNITY): Payer: Self-pay

## 2022-07-18 ENCOUNTER — Other Ambulatory Visit (HOSPITAL_COMMUNITY): Payer: Self-pay

## 2022-07-20 ENCOUNTER — Other Ambulatory Visit (HOSPITAL_COMMUNITY): Payer: Self-pay

## 2022-07-21 ENCOUNTER — Other Ambulatory Visit (HOSPITAL_COMMUNITY): Payer: Self-pay

## 2022-07-23 ENCOUNTER — Other Ambulatory Visit (HOSPITAL_COMMUNITY): Payer: Self-pay

## 2022-08-27 ENCOUNTER — Other Ambulatory Visit: Payer: Self-pay | Admitting: Nurse Practitioner

## 2022-08-27 ENCOUNTER — Other Ambulatory Visit (HOSPITAL_COMMUNITY): Payer: Self-pay

## 2022-08-27 DIAGNOSIS — F419 Anxiety disorder, unspecified: Secondary | ICD-10-CM

## 2022-08-28 ENCOUNTER — Other Ambulatory Visit (HOSPITAL_COMMUNITY): Payer: Self-pay

## 2022-08-28 MED ORDER — HYDROXYZINE HCL 10 MG PO TABS
10.0000 mg | ORAL_TABLET | Freq: Three times a day (TID) | ORAL | 1 refills | Status: DC | PRN
  Filled 2022-08-28: qty 90, 30d supply, fill #0
  Filled 2022-10-01: qty 90, 30d supply, fill #1

## 2022-08-29 ENCOUNTER — Other Ambulatory Visit (HOSPITAL_COMMUNITY): Payer: Self-pay

## 2022-09-18 ENCOUNTER — Ambulatory Visit: Payer: 59 | Admitting: Nurse Practitioner

## 2022-09-25 ENCOUNTER — Ambulatory Visit: Payer: 59 | Admitting: Nurse Practitioner

## 2022-09-26 ENCOUNTER — Encounter: Payer: Self-pay | Admitting: Nurse Practitioner

## 2022-10-01 ENCOUNTER — Other Ambulatory Visit (HOSPITAL_COMMUNITY): Payer: Self-pay

## 2022-10-02 ENCOUNTER — Other Ambulatory Visit (HOSPITAL_COMMUNITY): Payer: Self-pay

## 2022-10-03 ENCOUNTER — Other Ambulatory Visit (HOSPITAL_COMMUNITY): Payer: Self-pay

## 2022-11-05 ENCOUNTER — Other Ambulatory Visit: Payer: Self-pay | Admitting: Nurse Practitioner

## 2022-11-05 ENCOUNTER — Other Ambulatory Visit (HOSPITAL_COMMUNITY): Payer: Self-pay

## 2022-11-05 DIAGNOSIS — F419 Anxiety disorder, unspecified: Secondary | ICD-10-CM

## 2022-11-05 MED ORDER — INFLUENZA VIRUS VACC SPLIT PF (FLUZONE) 0.5 ML IM SUSY
0.5000 mL | PREFILLED_SYRINGE | Freq: Once | INTRAMUSCULAR | 0 refills | Status: AC
  Filled 2022-11-05: qty 0.5, 1d supply, fill #0

## 2022-11-05 MED ORDER — HYDROXYZINE HCL 10 MG PO TABS
10.0000 mg | ORAL_TABLET | Freq: Three times a day (TID) | ORAL | 1 refills | Status: DC | PRN
  Filled 2022-11-05 – 2022-12-24 (×3): qty 90, 30d supply, fill #0
  Filled 2023-01-25: qty 90, 30d supply, fill #1

## 2022-11-07 ENCOUNTER — Other Ambulatory Visit (HOSPITAL_COMMUNITY): Payer: Self-pay

## 2022-11-08 ENCOUNTER — Other Ambulatory Visit (HOSPITAL_COMMUNITY): Payer: Self-pay

## 2022-11-08 ENCOUNTER — Encounter (HOSPITAL_COMMUNITY): Payer: Self-pay

## 2022-11-15 ENCOUNTER — Other Ambulatory Visit (HOSPITAL_COMMUNITY): Payer: Self-pay

## 2022-11-16 ENCOUNTER — Other Ambulatory Visit (HOSPITAL_COMMUNITY): Payer: Self-pay

## 2022-11-16 ENCOUNTER — Other Ambulatory Visit: Payer: Self-pay

## 2022-11-23 ENCOUNTER — Other Ambulatory Visit (HOSPITAL_COMMUNITY): Payer: Self-pay

## 2022-11-26 ENCOUNTER — Other Ambulatory Visit (HOSPITAL_COMMUNITY): Payer: Self-pay

## 2022-11-30 ENCOUNTER — Other Ambulatory Visit (HOSPITAL_COMMUNITY): Payer: Self-pay

## 2022-12-24 ENCOUNTER — Other Ambulatory Visit (HOSPITAL_COMMUNITY): Payer: Self-pay

## 2022-12-26 ENCOUNTER — Other Ambulatory Visit (HOSPITAL_COMMUNITY): Payer: Self-pay

## 2023-01-25 ENCOUNTER — Other Ambulatory Visit (HOSPITAL_COMMUNITY): Payer: Self-pay

## 2023-01-28 ENCOUNTER — Other Ambulatory Visit (HOSPITAL_COMMUNITY): Payer: Self-pay

## 2023-02-25 ENCOUNTER — Other Ambulatory Visit (HOSPITAL_COMMUNITY): Payer: Self-pay

## 2023-02-25 ENCOUNTER — Other Ambulatory Visit: Payer: Self-pay | Admitting: Nurse Practitioner

## 2023-02-25 DIAGNOSIS — F419 Anxiety disorder, unspecified: Secondary | ICD-10-CM

## 2023-02-25 MED ORDER — HYDROXYZINE HCL 10 MG PO TABS
10.0000 mg | ORAL_TABLET | Freq: Three times a day (TID) | ORAL | 0 refills | Status: DC | PRN
  Filled 2023-02-25 – 2023-02-27 (×3): qty 90, 30d supply, fill #0

## 2023-02-25 NOTE — Telephone Encounter (Signed)
 Needs a cpe in the next 30 days please

## 2023-02-25 NOTE — Telephone Encounter (Signed)
 Called pt but phone disconnected

## 2023-02-26 ENCOUNTER — Other Ambulatory Visit: Payer: Self-pay

## 2023-02-26 ENCOUNTER — Other Ambulatory Visit (HOSPITAL_COMMUNITY): Payer: Self-pay

## 2023-02-27 ENCOUNTER — Other Ambulatory Visit (HOSPITAL_COMMUNITY): Payer: Self-pay

## 2023-02-28 NOTE — Telephone Encounter (Signed)
 Please try to reach out to pt again to see if appt can be made

## 2023-02-28 NOTE — Telephone Encounter (Signed)
 Called pt again and phone is disconnected

## 2023-03-04 NOTE — Telephone Encounter (Signed)
 Unable to reach patient. Phone number not in service.  3rd attempt to reach patient.  Sending MyChart message.

## 2023-03-25 ENCOUNTER — Other Ambulatory Visit (HOSPITAL_COMMUNITY): Payer: Self-pay

## 2023-03-25 ENCOUNTER — Other Ambulatory Visit: Payer: Self-pay | Admitting: Nurse Practitioner

## 2023-03-25 DIAGNOSIS — F419 Anxiety disorder, unspecified: Secondary | ICD-10-CM

## 2023-03-25 MED ORDER — HYDROXYZINE HCL 10 MG PO TABS
10.0000 mg | ORAL_TABLET | Freq: Three times a day (TID) | ORAL | 0 refills | Status: DC | PRN
  Filled 2023-03-25: qty 90, 30d supply, fill #0

## 2023-04-24 ENCOUNTER — Other Ambulatory Visit (HOSPITAL_COMMUNITY): Payer: Self-pay

## 2023-04-24 ENCOUNTER — Other Ambulatory Visit: Payer: Self-pay | Admitting: Nurse Practitioner

## 2023-04-24 DIAGNOSIS — F419 Anxiety disorder, unspecified: Secondary | ICD-10-CM

## 2023-04-24 MED ORDER — HYDROXYZINE HCL 10 MG PO TABS
10.0000 mg | ORAL_TABLET | Freq: Three times a day (TID) | ORAL | 0 refills | Status: DC | PRN
  Filled 2023-04-24: qty 90, 30d supply, fill #0

## 2023-04-25 ENCOUNTER — Other Ambulatory Visit (HOSPITAL_COMMUNITY): Payer: Self-pay

## 2023-04-27 ENCOUNTER — Other Ambulatory Visit (HOSPITAL_COMMUNITY): Payer: Self-pay

## 2023-05-22 ENCOUNTER — Other Ambulatory Visit (HOSPITAL_COMMUNITY): Payer: Self-pay

## 2023-05-22 ENCOUNTER — Other Ambulatory Visit: Payer: Self-pay | Admitting: Nurse Practitioner

## 2023-05-22 DIAGNOSIS — F419 Anxiety disorder, unspecified: Secondary | ICD-10-CM

## 2023-05-23 ENCOUNTER — Other Ambulatory Visit (HOSPITAL_COMMUNITY): Payer: Self-pay

## 2023-05-23 MED ORDER — HYDROXYZINE HCL 10 MG PO TABS
10.0000 mg | ORAL_TABLET | Freq: Three times a day (TID) | ORAL | 0 refills | Status: DC | PRN
  Filled 2023-05-23 – 2023-06-11 (×2): qty 90, 30d supply, fill #0

## 2023-05-29 ENCOUNTER — Other Ambulatory Visit (HOSPITAL_COMMUNITY): Payer: Self-pay

## 2023-06-04 ENCOUNTER — Other Ambulatory Visit (HOSPITAL_COMMUNITY): Payer: Self-pay

## 2023-06-11 ENCOUNTER — Other Ambulatory Visit (HOSPITAL_COMMUNITY): Payer: Self-pay

## 2023-07-10 ENCOUNTER — Other Ambulatory Visit: Payer: Self-pay | Admitting: Nurse Practitioner

## 2023-07-10 ENCOUNTER — Other Ambulatory Visit (HOSPITAL_COMMUNITY): Payer: Self-pay

## 2023-07-10 DIAGNOSIS — F419 Anxiety disorder, unspecified: Secondary | ICD-10-CM

## 2023-07-10 MED ORDER — HYDROXYZINE HCL 10 MG PO TABS
10.0000 mg | ORAL_TABLET | Freq: Three times a day (TID) | ORAL | 0 refills | Status: DC | PRN
  Filled 2023-07-10: qty 90, 30d supply, fill #0

## 2023-07-16 ENCOUNTER — Other Ambulatory Visit (HOSPITAL_COMMUNITY): Payer: Self-pay

## 2023-08-14 ENCOUNTER — Other Ambulatory Visit (HOSPITAL_COMMUNITY): Payer: Self-pay

## 2023-08-14 ENCOUNTER — Other Ambulatory Visit: Payer: Self-pay | Admitting: Nurse Practitioner

## 2023-08-14 DIAGNOSIS — F419 Anxiety disorder, unspecified: Secondary | ICD-10-CM

## 2023-08-14 MED ORDER — HYDROXYZINE HCL 10 MG PO TABS
10.0000 mg | ORAL_TABLET | Freq: Three times a day (TID) | ORAL | 0 refills | Status: DC | PRN
  Filled 2023-08-14: qty 90, 30d supply, fill #0

## 2023-10-21 ENCOUNTER — Other Ambulatory Visit (HOSPITAL_COMMUNITY): Payer: Self-pay

## 2023-10-21 ENCOUNTER — Other Ambulatory Visit: Payer: Self-pay | Admitting: Nurse Practitioner

## 2023-10-21 ENCOUNTER — Other Ambulatory Visit: Payer: Self-pay

## 2023-10-21 DIAGNOSIS — F419 Anxiety disorder, unspecified: Secondary | ICD-10-CM

## 2023-10-21 MED ORDER — HYDROXYZINE HCL 10 MG PO TABS
10.0000 mg | ORAL_TABLET | Freq: Three times a day (TID) | ORAL | 0 refills | Status: DC | PRN
  Filled 2023-10-21: qty 90, 30d supply, fill #0

## 2023-10-22 ENCOUNTER — Other Ambulatory Visit (HOSPITAL_COMMUNITY): Payer: Self-pay

## 2023-10-22 MED ORDER — FLUZONE 0.5 ML IM SUSY
0.5000 mL | PREFILLED_SYRINGE | Freq: Once | INTRAMUSCULAR | 0 refills | Status: AC
Start: 2023-10-22 — End: 2023-10-23
  Filled 2023-10-22: qty 0.5, 1d supply, fill #0

## 2023-12-03 ENCOUNTER — Other Ambulatory Visit (HOSPITAL_COMMUNITY): Payer: Self-pay

## 2023-12-03 ENCOUNTER — Other Ambulatory Visit: Payer: Self-pay | Admitting: Nurse Practitioner

## 2023-12-03 DIAGNOSIS — F419 Anxiety disorder, unspecified: Secondary | ICD-10-CM

## 2023-12-04 ENCOUNTER — Other Ambulatory Visit (HOSPITAL_COMMUNITY): Payer: Self-pay

## 2023-12-04 MED ORDER — HYDROXYZINE HCL 10 MG PO TABS
10.0000 mg | ORAL_TABLET | Freq: Three times a day (TID) | ORAL | 1 refills | Status: AC | PRN
  Filled 2023-12-04 – 2023-12-19 (×2): qty 90, 30d supply, fill #0
  Filled 2024-01-14 – 2024-02-07 (×2): qty 90, 30d supply, fill #1

## 2023-12-13 ENCOUNTER — Other Ambulatory Visit (HOSPITAL_COMMUNITY): Payer: Self-pay

## 2023-12-19 ENCOUNTER — Other Ambulatory Visit (HOSPITAL_COMMUNITY): Payer: Self-pay

## 2024-01-14 ENCOUNTER — Other Ambulatory Visit: Payer: Self-pay

## 2024-01-14 ENCOUNTER — Other Ambulatory Visit (HOSPITAL_COMMUNITY): Payer: Self-pay

## 2024-01-24 ENCOUNTER — Other Ambulatory Visit (HOSPITAL_COMMUNITY): Payer: Self-pay

## 2024-02-07 ENCOUNTER — Other Ambulatory Visit (HOSPITAL_COMMUNITY): Payer: Self-pay

## 2024-03-26 ENCOUNTER — Encounter: Admitting: Nurse Practitioner
